# Patient Record
Sex: Female | Born: 2000 | Race: Black or African American | Hispanic: No | Marital: Single | Smoking: Never smoker
Health system: Southern US, Community
[De-identification: ages and names within clinical notes are randomized; demographics above are authoritative.]

## PROBLEM LIST (undated history)

## (undated) DIAGNOSIS — J45909 Unspecified asthma, uncomplicated: Secondary | ICD-10-CM

## (undated) DIAGNOSIS — E669 Obesity, unspecified: Secondary | ICD-10-CM

## (undated) DIAGNOSIS — T7840XA Allergy, unspecified, initial encounter: Secondary | ICD-10-CM

## (undated) HISTORY — DX: Unspecified asthma, uncomplicated: J45.909

## (undated) HISTORY — PX: ADENOIDECTOMY: SUR15

## (undated) HISTORY — DX: Allergy, unspecified, initial encounter: T78.40XA

## (undated) HISTORY — DX: Obesity, unspecified: E66.9

## (undated) HISTORY — PX: TONSILLECTOMY: SUR1361

---
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
## 2019-09-06 DIAGNOSIS — Z20822 Contact with and (suspected) exposure to covid-19: Secondary | ICD-10-CM | POA: Diagnosis not present

## 2019-10-02 DIAGNOSIS — F321 Major depressive disorder, single episode, moderate: Secondary | ICD-10-CM | POA: Diagnosis not present

## 2019-10-23 DIAGNOSIS — F321 Major depressive disorder, single episode, moderate: Secondary | ICD-10-CM | POA: Diagnosis not present

## 2019-11-20 DIAGNOSIS — F321 Major depressive disorder, single episode, moderate: Secondary | ICD-10-CM | POA: Diagnosis not present

## 2019-12-11 DIAGNOSIS — F321 Major depressive disorder, single episode, moderate: Secondary | ICD-10-CM | POA: Diagnosis not present

## 2019-12-25 DIAGNOSIS — F321 Major depressive disorder, single episode, moderate: Secondary | ICD-10-CM | POA: Diagnosis not present

## 2020-01-13 DIAGNOSIS — F321 Major depressive disorder, single episode, moderate: Secondary | ICD-10-CM | POA: Diagnosis not present

## 2020-01-29 DIAGNOSIS — F321 Major depressive disorder, single episode, moderate: Secondary | ICD-10-CM | POA: Diagnosis not present

## 2020-02-19 DIAGNOSIS — F321 Major depressive disorder, single episode, moderate: Secondary | ICD-10-CM | POA: Diagnosis not present

## 2020-02-26 DIAGNOSIS — F321 Major depressive disorder, single episode, moderate: Secondary | ICD-10-CM | POA: Diagnosis not present

## 2020-03-04 DIAGNOSIS — F321 Major depressive disorder, single episode, moderate: Secondary | ICD-10-CM | POA: Diagnosis not present

## 2020-03-16 DIAGNOSIS — F321 Major depressive disorder, single episode, moderate: Secondary | ICD-10-CM | POA: Diagnosis not present

## 2020-04-01 DIAGNOSIS — F321 Major depressive disorder, single episode, moderate: Secondary | ICD-10-CM | POA: Diagnosis not present

## 2020-04-08 ENCOUNTER — Other Ambulatory Visit: Payer: Self-pay

## 2020-04-08 ENCOUNTER — Encounter: Payer: Self-pay | Admitting: Family Medicine

## 2020-04-08 ENCOUNTER — Ambulatory Visit: Payer: Federal, State, Local not specified - PPO | Admitting: Family Medicine

## 2020-04-08 VITALS — BP 120/82 | HR 63 | Temp 98.2°F | Ht 68.5 in | Wt 264.2 lb

## 2020-04-08 DIAGNOSIS — J4520 Mild intermittent asthma, uncomplicated: Secondary | ICD-10-CM | POA: Insufficient documentation

## 2020-04-08 DIAGNOSIS — F32A Depression, unspecified: Secondary | ICD-10-CM

## 2020-04-08 DIAGNOSIS — Z634 Disappearance and death of family member: Secondary | ICD-10-CM | POA: Insufficient documentation

## 2020-04-08 DIAGNOSIS — E669 Obesity, unspecified: Secondary | ICD-10-CM

## 2020-04-08 DIAGNOSIS — L02411 Cutaneous abscess of right axilla: Secondary | ICD-10-CM | POA: Insufficient documentation

## 2020-04-08 DIAGNOSIS — R7309 Other abnormal glucose: Secondary | ICD-10-CM | POA: Insufficient documentation

## 2020-04-08 DIAGNOSIS — F329 Major depressive disorder, single episode, unspecified: Secondary | ICD-10-CM

## 2020-04-08 MED ORDER — ALBUTEROL SULFATE HFA 108 (90 BASE) MCG/ACT IN AERS: 2.0000 | INHALATION_SPRAY | Freq: Four times a day (QID) | RESPIRATORY_TRACT | 0 refills | Status: DC | PRN

## 2020-04-08 NOTE — Progress Notes (Signed)
Subjective:    Patient ID: Erica Moody, female    DOB: 11-28-2000, 19 y.o.   MRN: [id number]  HPI Chief Complaint  Patient presents with   new pt    new pt get established. has cyst under right arm for the last couple years. comes and goes   She is new to the practice and here to establish care.  Previous medical care: Calla Kicks, NP pediatrician   Other providers: None   Complains of an abscess in her right axilla for the past week. States has been getting abscesses in both axilla intermittently over the past few years. States she was advised to see a dermatologist by her previous PCP but she has not yet.   Denies fever, chills, dizziness,  abdominal pain, N/V/D.    Asthma- since childhood.  Triggers are exercise and allergies all year.  Does not have an albuterol inhaler.   States she has depression due to loss of her mother a few months ago. She also had a cousin pass away. She is now living with her grandmother. Her father was paying for her to get counseling but states she was told her bill was not paid so she can not continue with her current counselor.  Denies any thoughts of SI Is not self medicating.    Social history: Lives with her grandmother, works as a Investment banker, operational at [employer]  Denies smoking and drug use. Alcohol once weekly    Last Menstrual cycle: last week. History of irregular periods.  Denies ever being sexually active   Depression screen [facility] 2/9 04/08/2020 02/22/2016  Decreased Interest 1 0  Down, Depressed, Hopeless 2 1  PHQ - 2 Score 3 1  Altered sleeping 3 0  Tired, decreased energy 3 0  Change in appetite 0 0  Feeling bad or failure about yourself  0 1  Trouble concentrating 2 0  Moving slowly or fidgety/restless 0 0  Suicidal thoughts 0 0  PHQ-9 Score 11 2  Difficult doing work/chores Not difficult at all -      Reviewed allergies, medications, past medical, surgical, family, and social history.    Review of Systems Pertinent  positives and negatives in the history of present illness.     Objective:   Physical Exam Constitutional:      Appearance: Normal appearance.  Cardiovascular:     Rate and Rhythm: Normal rate and regular rhythm.     Pulses: Normal pulses.  Pulmonary:     Effort: Pulmonary effort is normal.     Breath sounds: Normal breath sounds.  Skin:    General: Skin is warm and dry.     Comments: Right axilla with abscess, mild induration, no fluctuance. Mild surrounding erythema  Neurological:     Mental Status: She is alert.    BP 120/82    Pulse 63    Temp 98.2 F (36.8 C)    Ht 5' 8.5" (1.74 m)    Wt 264 lb 3.2 oz (119.8 kg)    BMI 39.59 kg/m        Assessment & Plan:  Abscess of right axilla - Plan: CBC with Differential/Platelet, Comprehensive metabolic panel -she has been dealing with this for quite some time. I will prescribe an antibiotic. Use warm compresses. Refer to dermatologist.   Mild intermittent asthma without complication - Plan: albuterol (VENTOLIN HFA) 108 (90 Base) MCG/ACT inhaler -controlled. Refill albuterol.   Obesity (BMI 30-39.9) - Plan: CBC with Differential/Platelet, Comprehensive metabolic panel, Hemoglobin  A1c, TSH, T4, free -counseling on healthy diet   Elevated hemoglobin A1c - Plan: Hemoglobin A1c -hx of Hgb A1c 5.7%  Death of parent -discussed finding a counselor and I will give her a list. Discussed grief and stages and allowing herself to feel all the stages.   Depression, unspecified depression type -recommend counseling. Follow up in 4 weeks

## 2020-04-08 NOTE — Patient Instructions (Addendum)
Dermatology offices  [facility]: Phone #: [phone] Address: [address], [place], [place] [address]  [facility]: Phone: [phone]  Address: [address], [place], [place] [address]  [facility]: [phone] Address: [address] [place], [place] [address]  [facility] Address: [address] Sherian Maroon [place], [place] [address] Phone: [phone]   Call and schedule for grief counseling  Baxter Kail-  Ask about grief counseling [phone]  [facility]  Outpatient counseling  [phone]

## 2020-04-09 LAB — COMPREHENSIVE METABOLIC PANEL
ALT: 14 IU/L (ref 0–32)
AST: 20 IU/L (ref 0–40)
Albumin/Globulin Ratio: 1.3 (ref 1.2–2.2)
Albumin: 4.1 g/dL (ref 3.9–5.0)
Alkaline Phosphatase: 68 IU/L (ref 45–106)
BUN/Creatinine Ratio: 10 (ref 9–23)
BUN: 8 mg/dL (ref 6–20)
Bilirubin Total: 0.2 mg/dL (ref 0.0–1.2)
CO2: 23 mmol/L (ref 20–29)
Calcium: 9.6 mg/dL (ref 8.7–10.2)
Chloride: 104 mmol/L (ref 96–106)
Creatinine, Ser: 0.78 mg/dL (ref 0.57–1.00)
GFR calc Af Amer: 127 mL/min/{1.73_m2} (ref >59)
GFR calc non Af Amer: 111 mL/min/{1.73_m2} (ref >59)
Globulin, Total: 3.1 g/dL (ref 1.5–4.5)
Glucose: 90 mg/dL (ref 65–99)
Potassium: 4.1 mmol/L (ref 3.5–5.2)
Sodium: 141 mmol/L (ref 134–144)
Total Protein: 7.2 g/dL (ref 6.0–8.5)

## 2020-04-09 LAB — CBC WITH DIFFERENTIAL/PLATELET
Basophils Absolute: 0 10*3/uL (ref 0.0–0.2)
Basos: 0 %
EOS (ABSOLUTE): 0.1 10*3/uL (ref 0.0–0.4)
Eos: 2 %
Hematocrit: 36.2 % (ref 34.0–46.6)
Hemoglobin: 11.1 g/dL (ref 11.1–15.9)
Immature Grans (Abs): 0 10*3/uL (ref 0.0–0.1)
Immature Granulocytes: 0 %
Lymphocytes Absolute: 1.9 10*3/uL (ref 0.7–3.1)
Lymphs: 32 %
MCH: 22.7 pg — ABNORMAL LOW (ref 26.6–33.0)
MCHC: 30.7 g/dL — ABNORMAL LOW (ref 31.5–35.7)
MCV: 74 fL — ABNORMAL LOW (ref 79–97)
Monocytes Absolute: 0.5 10*3/uL (ref 0.1–0.9)
Monocytes: 9 %
Neutrophils Absolute: 3.4 10*3/uL (ref 1.4–7.0)
Neutrophils: 57 %
Platelets: 397 10*3/uL (ref 150–450)
RBC: 4.89 x10E6/uL (ref 3.77–5.28)
RDW: 14.2 % (ref 11.7–15.4)
WBC: 5.8 10*3/uL (ref 3.4–10.8)

## 2020-04-09 LAB — HEMOGLOBIN A1C
Est. average glucose Bld gHb Est-mCnc: 120 mg/dL
Hgb A1c MFr Bld: 5.8 % — ABNORMAL HIGH (ref 4.8–5.6)

## 2020-04-09 LAB — T4, FREE: Free T4: 1.24 ng/dL (ref 0.93–1.60)

## 2020-04-09 LAB — TSH: TSH: 2.37 u[IU]/mL (ref 0.450–4.500)

## 2020-04-09 MED ORDER — SULFAMETHOXAZOLE-TRIMETHOPRIM 800-160 MG PO TABS: 1.0000 | ORAL_TABLET | Freq: Two times a day (BID) | ORAL | 0 refills | Status: DC

## 2020-04-10 ENCOUNTER — Encounter: Payer: Self-pay | Admitting: Family Medicine

## 2020-04-10 DIAGNOSIS — R7303 Prediabetes: Secondary | ICD-10-CM | POA: Insufficient documentation

## 2020-04-10 HISTORY — DX: Prediabetes: R73.03

## 2020-04-10 NOTE — Progress Notes (Signed)
Her labs show that she has prediabetes which increases her risk of developing diabetes down the road if she does not change her diet and increase her activity level. I recommend cutting back on sugar (drinks, candy, cookies, cakes and other snacks with sugar in them) and carbohydrates such as bread, potatoes, pasta, rice.  Her labs are fine otherwise. If she would like to discuss any of this further, we can at her follow up or sooner if she prefers.

## 2020-04-15 DIAGNOSIS — F321 Major depressive disorder, single episode, moderate: Secondary | ICD-10-CM | POA: Diagnosis not present

## 2020-05-04 NOTE — Progress Notes (Signed)
   Subjective:    Patient ID: Erica Moody, female    DOB: [birth year], 19 y.o.   MRN: [id number]  HPI Chief Complaint  Patient presents with  . 4 week follow-up    4 week follow-up. doing fine   She is here to follow up on depression and anxiety.  She is living with her grandmother and uncle. 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   Has seen a counselor, Duwayne Heck, and has another appointment tomorrow. States they are working on her anxiety issues.   Sleeping only 5 hours per night since January. States she has trouble falling asleep. Has tried melatonin and it did not help. Exercise helps. She eats a large meal before bed.  Eating once daily.    Denies fever, chills, dizziness, chest pain, palpitations, shortness of breath, abdominal pain, N/V/D, urinary symptoms.   LMP: last week   Only uses albuterol before exercise.   Prediabetes- Hgb A1c 5.8%      Review of Systems Pertinent positives and negatives in the history of present illness.     Objective:   Physical Exam BP 120/80   Pulse (!) 55   Wt 259 lb 6.4 oz (117.7 kg)   BMI 38.87 kg/m   Alert and oriented and in no acute distress.  Her mood is depressed.  Not examined otherwise.      Assessment & Plan:  Depression, unspecified depression type  Anxiety  Death of parent  Prediabetes  Insomnia, unspecified type  Recommend she continue seeing her counselor.  She is giving herself permission to deal with her grief.  Good sleep hygiene discussed.  Counseling on ways to help her retrain herself to go to sleep.  Recommend eating small frequent meals throughout the day and not eating before bedtime. Discussed healthy diet and lifestyle.  Reviewed labs from previous visit.  Discussed prediabetes Follow-up as needed

## 2020-05-05 ENCOUNTER — Encounter: Payer: Self-pay | Admitting: Family Medicine

## 2020-05-05 ENCOUNTER — Other Ambulatory Visit: Payer: Self-pay

## 2020-05-05 ENCOUNTER — Ambulatory Visit: Payer: Federal, State, Local not specified - PPO | Admitting: Family Medicine

## 2020-05-05 VITALS — BP 120/80 | HR 55 | Wt 259.4 lb

## 2020-05-05 DIAGNOSIS — F329 Major depressive disorder, single episode, unspecified: Secondary | ICD-10-CM

## 2020-05-05 DIAGNOSIS — F419 Anxiety disorder, unspecified: Secondary | ICD-10-CM

## 2020-05-05 DIAGNOSIS — Z634 Disappearance and death of family member: Secondary | ICD-10-CM | POA: Diagnosis not present

## 2020-05-05 DIAGNOSIS — R7303 Prediabetes: Secondary | ICD-10-CM

## 2020-05-05 DIAGNOSIS — F32A Depression, unspecified: Secondary | ICD-10-CM

## 2020-05-05 DIAGNOSIS — G4700 Insomnia, unspecified: Secondary | ICD-10-CM

## 2020-05-06 DIAGNOSIS — F321 Major depressive disorder, single episode, moderate: Secondary | ICD-10-CM | POA: Diagnosis not present

## 2020-05-11 DIAGNOSIS — L700 Acne vulgaris: Secondary | ICD-10-CM | POA: Diagnosis not present

## 2020-05-11 DIAGNOSIS — L732 Hidradenitis suppurativa: Secondary | ICD-10-CM | POA: Diagnosis not present

## 2020-05-11 DIAGNOSIS — L249 Irritant contact dermatitis, unspecified cause: Secondary | ICD-10-CM | POA: Diagnosis not present

## 2020-05-27 DIAGNOSIS — F321 Major depressive disorder, single episode, moderate: Secondary | ICD-10-CM | POA: Diagnosis not present

## 2020-06-17 DIAGNOSIS — F321 Major depressive disorder, single episode, moderate: Secondary | ICD-10-CM | POA: Diagnosis not present

## 2020-07-12 DIAGNOSIS — L732 Hidradenitis suppurativa: Secondary | ICD-10-CM | POA: Diagnosis not present

## 2020-07-12 DIAGNOSIS — L700 Acne vulgaris: Secondary | ICD-10-CM | POA: Diagnosis not present

## 2020-07-20 DIAGNOSIS — F321 Major depressive disorder, single episode, moderate: Secondary | ICD-10-CM | POA: Diagnosis not present

## 2020-07-23 ENCOUNTER — Ambulatory Visit: Payer: Federal, State, Local not specified - PPO | Admitting: Family Medicine

## 2020-07-23 ENCOUNTER — Encounter: Payer: Self-pay | Admitting: Family Medicine

## 2020-07-23 ENCOUNTER — Other Ambulatory Visit: Payer: Self-pay

## 2020-07-23 VITALS — BP 120/80 | HR 61 | Wt 259.2 lb

## 2020-07-23 DIAGNOSIS — Z30011 Encounter for initial prescription of contraceptive pills: Secondary | ICD-10-CM | POA: Diagnosis not present

## 2020-07-23 DIAGNOSIS — L732 Hidradenitis suppurativa: Secondary | ICD-10-CM | POA: Diagnosis not present

## 2020-07-23 DIAGNOSIS — N920 Excessive and frequent menstruation with regular cycle: Secondary | ICD-10-CM | POA: Diagnosis not present

## 2020-07-23 LAB — POCT URINE PREGNANCY: Preg Test, Ur: NEGATIVE

## 2020-07-23 MED ORDER — NORETHIN ACE-ETH ESTRAD-FE 1-20 MG-MCG PO TABS: 1.0000 | ORAL_TABLET | Freq: Every day | ORAL | 11 refills | Status: DC

## 2020-07-23 NOTE — Progress Notes (Signed)
° °  Subjective:    Patient ID: Erica Moody, female    DOB: [birth year], 19 y.o.   MRN: [id number]  HPI Chief Complaint  Patient presents with   discuss birth control    Never been on birth control before, irregular periods   She is here to discuss starting on birth control for the first time. She would like birth control pills and prefers to stop her periods.   States her period this month lasted over 2 weeks and just ended 4-5 days ago. States this happened one year ago.  LMP: 06/20/2020   Normally her periods last 7-10 days and are basically regular. States she has severe cramping and nausea the first 2-3 days of her cycle.   Denies ever being sexually active and has not plan to become sexually active. States she does not want birth control to prevent pregnancy, only to help with her periods.   History of migraine headaches but none in the past few months.  Does not smoke.  Denies personal or family history of DVT or blood clots.  Denies fever, chills, dizziness, chest pain, palpitations, shortness of breath, abdominal pain, N/V/D, urinary symptoms, or vaginal discharge.   She is taking Doxycycline daily for hidradenitis suppurative.  This is prescribed by her dermatologist.  She is seeing a therapist for depression.   Reviewed allergies, medications, past medical, surgical, family, and social history.    Review of Systems Pertinent positives and negatives in the history of present illness.     Objective:   Physical Exam BP 120/80    Pulse 61    Wt 259 lb 3.2 oz (117.6 kg)    LMP 06/20/2020 (Exact Date)    BMI 38.84 kg/m   Alert and oriented and in no acute distress.  Not otherwise examined.      Assessment & Plan:  Encounter for BCP (birth control pills) initial prescription - Plan: POCT urine pregnancy, norethindrone-ethinyl estradiol (JUNEL FE 1/20) 1-20 MG-MCG tablet  Hidradenitis suppurativa  Menorrhagia with regular cycle - Plan: POCT urine  pregnancy  In-depth counseling on birth control options and offered to send her to a gynecologist to discuss these further in case she would prefer IUD, Nexplanon or other types of birth control.  She is only interested in starting on birth control pills.  She would like to take these continuously.  Discussed proper administration of the pill in order for it to be most effective.  Discussed most common side effects.  she is aware that she would need to use a second form of contraception for the first month if she decides to become sexually active.  She is also aware that birth control pills do not prevent STDs.  She denies being sexually active. Discussed obesity and migraine headaches may increase her risk of having an adverse event.  Discussed VTE risk. Urine pregnancy test is negative Encouraged her to follow-up with me 4 weeks after starting on the pill or sooner if she has any questions or concerns

## 2020-07-23 NOTE — Patient Instructions (Signed)
Birth control pills do not protect against any sexually transmitted infections.   Start this pack on the Sunday after your next cycle begins.   Avoid missing doses.   Call us with the antibiotic you are taking.   Follow up in 4 weeks     Oral Contraception Information Oral contraceptive pills (OCPs) are medicines taken to prevent pregnancy. OCPs are taken by mouth, and they work by:  Preventing the ovaries from releasing eggs.  Thickening mucus in the lower part of the uterus (cervix), which prevents sperm from entering the uterus.  Thinning the lining of the uterus (endometrium), which prevents a fertilized egg from attaching to the endometrium. OCPs are highly effective when taken exactly as prescribed. However, OCPs do not prevent STIs (sexually transmitted infections). Safe sex practices, such as using condoms while on an OCP, can help prevent STIs. Before starting OCPs Before you start taking OCPs, you may have a physical exam, blood test, and Pap test. However, you are not required to have a pelvic exam in order to be prescribed OCPs. Your health care provider will make sure you are a good candidate for oral contraception. OCPs are not a good option for certain women, including women who smoke and are older than 35 years, and women with a medical history of high blood pressure, deep vein thrombosis, pulmonary embolism, stroke, cardiovascular disease, or peripheral vascular disease. Discuss with your health care provider the possible side effects of the OCP you may be prescribed. When you start an OCP, be aware that it can take 2-3 months for your body to adjust to changes in hormone levels. Follow instructions from your health care provider about how to start taking your first cycle of OCPs. Depending on when you start the pill, you may need to use a backup form of birth control, such as condoms, during the first week. Make sure you know what steps to take if you ever forget to take the  pill. Types of oral contraception  The most common types of birth control pills contain the hormones estrogen and progestin (synthetic progesterone) or progestin only. The combination pill This type of pill contains estrogen and progestin hormones. Combination pills often come in packs of 21, 28, or 91 pills. For each pack, the last 7 pills may not contain hormones, which means you may stop taking the pills for 7 days. Menstrual bleeding occurs during the week that you do not take the pills or that you take the pills with no hormones in them. The minipill This type of pill contains the progestin hormone only. It comes in packs of 28 pills. All 28 pills contain the hormone. You take the pill every day. It is very important to take the pill at the same time each day. Advantages of oral contraceptive pills  Provides reliable and continuous contraception if taken as instructed.  May treat or decrease symptoms of: ? Menstrual period cramps. ? Irregular menstrual cycle or bleeding. ? Heavy menstrual flow. ? Abnormal uterine bleeding. ? Acne, depending on the type of pill. ? Polycystic ovarian syndrome. ? Endometriosis. ? Iron deficiency anemia. ? Premenstrual symptoms, including premenstrual dysphoric disorder.  May reduce the risk of endometrial and ovarian cancer.  Can be used as emergency contraception.  Prevents mislocated (ectopic) pregnancies and infections of the fallopian tubes. Things that can make oral contraceptive pills less effective OCPs can be less effective if:  You forget to take the pill at the same time every day. This is especially  important when taking the minipill.  You have a stomach or intestinal disease that reduces your body's ability to absorb the pill.  You take OCPs with other medicines that make OCPs less effective, such as antibiotics, certain HIV medicines, and some seizure medicines.  You take expired OCPs.  You forget to restart the pill on day 7, if  using the packs of 21 pills. Risks associated with oral contraceptive pills Oral contraceptive pills can sometimes cause side effects, such as:  Headache.  Depression.  Trouble sleeping.  Nausea and vomiting.  Breast tenderness.  Irregular bleeding or spotting during the first several months.  Bloating or fluid retention.  Increase in blood pressure. Combination pills are also associated with a small increase in the risk of:  Blood clots.  Heart attack.  Stroke. Summary  Oral contraceptive pills are medicines taken by mouth to prevent pregnancy. They are highly effective when taken exactly as prescribed.  The most common types of birth control pills contain the hormones estrogen and progestin (synthetic progesterone) or progestin only.  Before you start taking the pill, you may have a physical exam, blood test, and Pap test. Your health care provider will make sure you are a good candidate for oral contraception.  The combination pill may come in a 21-day pack, a 28-day pack, or a 91-day pack. The minipill contains the progesterone hormone only and comes in packs of 28 pills.  Oral contraceptive pills can sometimes cause side effects, such as headache, nausea, breast tenderness, or irregular bleeding. This information is not intended to replace advice given to you by your health care provider. Make sure you discuss any questions you have with your health care provider. Document Revised: 07/06/2017 Document Reviewed: 10/17/2016 Elsevier Patient Education  2020 [employer].

## 2020-08-24 ENCOUNTER — Ambulatory Visit: Payer: Federal, State, Local not specified - PPO | Admitting: Family Medicine

## 2020-08-26 ENCOUNTER — Other Ambulatory Visit: Payer: Self-pay

## 2020-08-26 ENCOUNTER — Encounter: Payer: Self-pay | Admitting: Family Medicine

## 2020-08-26 ENCOUNTER — Ambulatory Visit: Payer: Federal, State, Local not specified - PPO | Admitting: Family Medicine

## 2020-08-26 VITALS — BP 120/80 | HR 61 | Wt 260.6 lb

## 2020-08-26 DIAGNOSIS — F419 Anxiety disorder, unspecified: Secondary | ICD-10-CM

## 2020-08-26 DIAGNOSIS — N920 Excessive and frequent menstruation with regular cycle: Secondary | ICD-10-CM

## 2020-08-26 DIAGNOSIS — Z3041 Encounter for surveillance of contraceptive pills: Secondary | ICD-10-CM

## 2020-08-26 DIAGNOSIS — F32A Depression, unspecified: Secondary | ICD-10-CM | POA: Diagnosis not present

## 2020-08-26 NOTE — Patient Instructions (Signed)
You can call to schedule your appointment with the counselor. A few offices are listed below for you to call.    [facility] [address] [place], [place] [address]  Phone: [phone]  [facility]  [address], [place], [place] [address]  Phone: [phone]  [facility] [address] [place], [place] [address] [phone] for appointments   [facility]  [address] [place], [place] [address] [phone]  [facility]  [address]  (across from [facility])  [phone]   [facility] Resident only [address], [place], [place] [address] [phone]  [facility] [address] [place],  [address] [phone]       

## 2020-08-26 NOTE — Progress Notes (Signed)
   Subjective:    Patient ID: Erica Moody, female    DOB: 06/18/2001, 20 y.o.   MRN: [id number]  HPI Chief Complaint  Patient presents with  . follow-up    Follow-up on birth control   She is here to follow up on starting birth control pills as well as anxiety and depression.  States she took the first pack of OCPs and then did not take the placebo, she started the second pack but still had her menstrual cycle. Her cycle is currently still ongoing as usual. States this is day 8 and typically she bleeds for 10 days. She did not notice any improvement in abdominal cramping. No worsening symptoms. She is not sexually active. States she did have some mild symptoms after starting the pills including nausea, headache and breast tenderness. All symptoms have resolved.  She took the [employer] vaccine and states she is still having some body aches from the vaccine. She does plan on getting her second dose.  Depression and anxiety are an ongoing issue since her mother passed. States she has not been to her counselor recently. States her counselor actually with the practice. She is looking for a Therapist, nutritional. She denies any thoughts of SI. States she recently quit her job to focus on applying to [employer] school. States her home situation is fine, she is living with her aunt.  Denies fever, chills, dizziness, chest pain, palpitations, shortness of breath, abdominal pain, N/V/D, urinary symptoms, LE edema.    Review of Systems Pertinent positives and negatives in the history of present illness.     Objective:   Physical Exam BP 120/80   Pulse 61   Wt 260 lb 9.6 oz (118.2 kg)   BMI 39.05 kg/m   Alert and oriented and in no acute distress. Respirations unlabored. Her mood is depressed and her affect is fairly flat (unchanged). Denies SI      Assessment & Plan:  Menorrhagia with regular cycle  Visit for birth control pills maintenance  Anxiety and depression  Continue on  birth control pills. Follow-up with me in 6 weeks. If she is taking the pill continuously but still has her menstrual cycle next month then I will refer her to OB/GYN. I recommend counseling, finding a new therapist since her previous one moved. A list has been provided and she will call and schedule the appointment. She'll follow-up with me in 6 weeks virtually or sooner if needed.

## 2020-10-07 ENCOUNTER — Telehealth: Payer: Federal, State, Local not specified - PPO | Admitting: Family Medicine

## 2020-10-07 ENCOUNTER — Other Ambulatory Visit: Payer: Self-pay

## 2020-10-07 ENCOUNTER — Encounter: Payer: Self-pay | Admitting: Family Medicine

## 2020-10-07 VITALS — Wt 256.0 lb

## 2020-10-07 DIAGNOSIS — E669 Obesity, unspecified: Secondary | ICD-10-CM | POA: Diagnosis not present

## 2020-10-07 DIAGNOSIS — Z8742 Personal history of other diseases of the female genital tract: Secondary | ICD-10-CM | POA: Insufficient documentation

## 2020-10-07 DIAGNOSIS — F419 Anxiety disorder, unspecified: Secondary | ICD-10-CM | POA: Diagnosis not present

## 2020-10-07 DIAGNOSIS — N921 Excessive and frequent menstruation with irregular cycle: Secondary | ICD-10-CM | POA: Diagnosis not present

## 2020-10-07 DIAGNOSIS — F32A Depression, unspecified: Secondary | ICD-10-CM | POA: Insufficient documentation

## 2020-10-07 NOTE — Progress Notes (Signed)
   Subjective:  Documentation for virtual audio and video telecommunications through Caregility encounter:  The patient was located at home. 2 patient identifiers used.  The provider was located in the office. The patient did consent to this visit and is aware of possible charges through their insurance for this visit.  The other persons participating in this telemedicine service were none. Time spent on call was 15 minutes and in review of previous records 20 minutes total.  This virtual service is not related to other E/M service within previous 7 days.   Patient ID: Erica Moody, female    DOB: 2001/06/24, 19 y.o.   MRN: [id number]  HPI Chief Complaint  Patient presents with  . other    Having periods last longer than usually   Complains of having prolonged menstrual bleeding since 08/19/2020. States she is now at the 50 day mark of vaginal bleeding. States some days is just spotting and some days are normal period blood flow.   We started her on birth control for irregular menses in 07/2020. States she has been taking them daily at the same time.  Previously her periods were irregular and would last 2-4 weeks as well.   Denies ever having sex  Negative UPT done prior to initial prescription of OCPs  Denies fever, chills, headache, dizziness, chest pain, palpitations, shortness of breath, abdominal pain, N/V/D, urinary symptoms. No other bleeding noted.  No known history of bleeding disorders in family.    Prediabetes in 04/2020  History of anxiety and depression and states her mood is the same as the last time we spoke.  She is not on medication for this.  She was seeing a therapist but stopped and has not seen one since her last visit.  States her father pays for her medical visits and he "dropped the ball".  States she plans to call and schedule with one.  Normal thyroid function in 04/2020 and no anemia at that time.   Review of Systems Pertinent positives and negatives  in the history of present illness.     Objective:   Physical Exam Wt 256 lb (116.1 kg)   LMP 08/19/2020   BMI 38.36 kg/m   Alert and oriented and in no acute distress.  Normal respirations.  Normal speech, mood and thought process.      Assessment & Plan:  Prolonged menstrual cycle -Reports having menstrual bleeding since August 19, 2020 in spite of being on continuous OCPs.  Birth control pills started in December 2021 due to irregular and prolonged menstrual bleeding.  She appears to be taking her pills appropriately.  Denies symptoms of anemia. I will refer her to OB/GYN for further evaluation and treatment.  History of irregular menstrual bleeding  Obesity (BMI 30-39.9)  Anxiety and depression -Mood appears unchanged.  States she will call today and schedule with a therapist.  No SI

## 2020-10-18 DIAGNOSIS — Z3041 Encounter for surveillance of contraceptive pills: Secondary | ICD-10-CM | POA: Diagnosis not present

## 2020-10-18 DIAGNOSIS — R5383 Other fatigue: Secondary | ICD-10-CM | POA: Diagnosis not present

## 2020-10-18 DIAGNOSIS — N925 Other specified irregular menstruation: Secondary | ICD-10-CM | POA: Diagnosis not present

## 2020-10-25 ENCOUNTER — Encounter: Payer: Federal, State, Local not specified - PPO | Admitting: Nurse Practitioner

## 2021-01-05 ENCOUNTER — Other Ambulatory Visit: Payer: Self-pay | Admitting: Family Medicine

## 2021-01-05 DIAGNOSIS — J4520 Mild intermittent asthma, uncomplicated: Secondary | ICD-10-CM

## 2021-01-26 DIAGNOSIS — L732 Hidradenitis suppurativa: Secondary | ICD-10-CM | POA: Diagnosis not present

## 2021-04-01 DIAGNOSIS — F4321 Adjustment disorder with depressed mood: Secondary | ICD-10-CM | POA: Diagnosis not present

## 2021-04-01 DIAGNOSIS — F411 Generalized anxiety disorder: Secondary | ICD-10-CM | POA: Diagnosis not present

## 2021-04-13 DIAGNOSIS — F4321 Adjustment disorder with depressed mood: Secondary | ICD-10-CM | POA: Diagnosis not present

## 2021-04-13 DIAGNOSIS — F411 Generalized anxiety disorder: Secondary | ICD-10-CM | POA: Diagnosis not present

## 2021-05-03 DIAGNOSIS — F4321 Adjustment disorder with depressed mood: Secondary | ICD-10-CM | POA: Diagnosis not present

## 2021-05-03 DIAGNOSIS — F411 Generalized anxiety disorder: Secondary | ICD-10-CM | POA: Diagnosis not present

## 2021-05-27 DIAGNOSIS — F411 Generalized anxiety disorder: Secondary | ICD-10-CM | POA: Diagnosis not present

## 2021-05-27 DIAGNOSIS — F4321 Adjustment disorder with depressed mood: Secondary | ICD-10-CM | POA: Diagnosis not present

## 2021-06-22 DIAGNOSIS — F411 Generalized anxiety disorder: Secondary | ICD-10-CM | POA: Diagnosis not present

## 2021-06-22 DIAGNOSIS — F4321 Adjustment disorder with depressed mood: Secondary | ICD-10-CM | POA: Diagnosis not present

## 2021-07-05 DIAGNOSIS — Z79891 Long term (current) use of opiate analgesic: Secondary | ICD-10-CM | POA: Diagnosis not present

## 2021-07-05 DIAGNOSIS — F331 Major depressive disorder, recurrent, moderate: Secondary | ICD-10-CM | POA: Diagnosis not present

## 2021-07-05 DIAGNOSIS — F411 Generalized anxiety disorder: Secondary | ICD-10-CM | POA: Diagnosis not present

## 2021-07-06 DIAGNOSIS — F411 Generalized anxiety disorder: Secondary | ICD-10-CM | POA: Diagnosis not present

## 2021-07-06 DIAGNOSIS — F4321 Adjustment disorder with depressed mood: Secondary | ICD-10-CM | POA: Diagnosis not present

## 2021-07-14 ENCOUNTER — Telehealth: Payer: Self-pay | Admitting: Family Medicine

## 2021-07-14 DIAGNOSIS — J4520 Mild intermittent asthma, uncomplicated: Secondary | ICD-10-CM

## 2021-07-14 NOTE — Telephone Encounter (Signed)
Pt called for refills of albuterol. Please send to [facility] on [address]. Pt can be reached at [phone].

## 2021-07-15 MED ORDER — ALBUTEROL SULFATE HFA 108 (90 BASE) MCG/ACT IN AERS: 2.0000 | INHALATION_SPRAY | Freq: Four times a day (QID) | RESPIRATORY_TRACT | 0 refills | Status: DC | PRN

## 2021-07-22 DIAGNOSIS — F411 Generalized anxiety disorder: Secondary | ICD-10-CM | POA: Diagnosis not present

## 2021-07-22 DIAGNOSIS — F4321 Adjustment disorder with depressed mood: Secondary | ICD-10-CM | POA: Diagnosis not present

## 2021-08-17 DIAGNOSIS — F411 Generalized anxiety disorder: Secondary | ICD-10-CM | POA: Diagnosis not present

## 2021-08-17 DIAGNOSIS — F331 Major depressive disorder, recurrent, moderate: Secondary | ICD-10-CM | POA: Diagnosis not present

## 2021-08-23 DIAGNOSIS — F411 Generalized anxiety disorder: Secondary | ICD-10-CM | POA: Diagnosis not present

## 2021-08-23 DIAGNOSIS — F4321 Adjustment disorder with depressed mood: Secondary | ICD-10-CM | POA: Diagnosis not present

## 2021-08-26 DIAGNOSIS — J4521 Mild intermittent asthma with (acute) exacerbation: Secondary | ICD-10-CM | POA: Diagnosis not present

## 2021-08-29 DIAGNOSIS — H6693 Otitis media, unspecified, bilateral: Secondary | ICD-10-CM | POA: Diagnosis not present

## 2021-08-29 DIAGNOSIS — J019 Acute sinusitis, unspecified: Secondary | ICD-10-CM | POA: Diagnosis not present

## 2021-09-02 ENCOUNTER — Encounter (HOSPITAL_COMMUNITY): Payer: Self-pay

## 2021-09-02 ENCOUNTER — Emergency Department (HOSPITAL_COMMUNITY)
Admission: EM | Admit: 2021-09-02 | Discharge: 2021-09-02 | Disposition: A | Discharge status: Home / Self Care | Payer: Federal, State, Local not specified - PPO | Attending: Emergency Medicine | Admitting: Emergency Medicine

## 2021-09-02 ENCOUNTER — Emergency Department (HOSPITAL_COMMUNITY): Payer: Federal, State, Local not specified - PPO

## 2021-09-02 ENCOUNTER — Other Ambulatory Visit: Payer: Self-pay

## 2021-09-02 DIAGNOSIS — R0600 Dyspnea, unspecified: Secondary | ICD-10-CM | POA: Diagnosis not present

## 2021-09-02 DIAGNOSIS — R0602 Shortness of breath: Secondary | ICD-10-CM | POA: Diagnosis not present

## 2021-09-02 DIAGNOSIS — R059 Cough, unspecified: Secondary | ICD-10-CM | POA: Diagnosis not present

## 2021-09-02 DIAGNOSIS — J4541 Moderate persistent asthma with (acute) exacerbation: Secondary | ICD-10-CM | POA: Insufficient documentation

## 2021-09-02 MED ORDER — PREDNISONE 20 MG PO TABS
60.0000 mg | ORAL_TABLET | Freq: Once | ORAL | Status: AC
  Administered 2021-09-02: 60 mg via ORAL
  Filled 2021-09-02: qty 3

## 2021-09-02 MED ORDER — ALBUTEROL (5 MG/ML) CONTINUOUS INHALATION SOLN
10.0000 mg/h | INHALATION_SOLUTION | Freq: Once | RESPIRATORY_TRACT | Status: DC
  Filled 2021-09-02: qty 20

## 2021-09-02 MED ORDER — ALBUTEROL SULFATE (2.5 MG/3ML) 0.083% IN NEBU
10.0000 mg | INHALATION_SOLUTION | Freq: Once | RESPIRATORY_TRACT | Status: AC
  Administered 2021-09-02: 10 mg via RESPIRATORY_TRACT

## 2021-09-02 MED ORDER — ALBUTEROL SULFATE HFA 108 (90 BASE) MCG/ACT IN AERS: 2.0000 | INHALATION_SPRAY | RESPIRATORY_TRACT | 0 refills | Status: DC | PRN

## 2021-09-02 MED ORDER — IPRATROPIUM-ALBUTEROL 0.5-2.5 (3) MG/3ML IN SOLN: 3.0000 mL | Freq: Once | RESPIRATORY_TRACT | Status: DC

## 2021-09-02 MED ORDER — PREDNISONE 10 MG PO TABS: ORAL_TABLET | ORAL | 0 refills | Status: DC

## 2021-09-02 MED ORDER — FLUTICASONE PROPIONATE 50 MCG/ACT NA SUSP
2.0000 | Freq: Every day | NASAL | Status: DC
  Administered 2021-09-02: 2 via NASAL
  Filled 2021-09-02: qty 16

## 2021-09-02 MED ORDER — ALBUTEROL SULFATE HFA 108 (90 BASE) MCG/ACT IN AERS
2.0000 | INHALATION_SPRAY | Freq: Once | RESPIRATORY_TRACT | Status: AC
  Administered 2021-09-02: 2 via RESPIRATORY_TRACT
  Filled 2021-09-02: qty 6.7

## 2021-09-02 NOTE — Discharge Instructions (Addendum)
We sent prescriptions to your pharmacy.  Start the prednisone around 6 PM tonight.  Use the fluticasone nasal spray 1 spray in each nostril, twice a day until the bottle is empty.  Call the pulmonologist for a follow-up appointment in a week or so.  Return here if your condition worsens.  Try to not use the albuterol inhaler more than 2 puffs every 3-4 hours.

## 2021-09-02 NOTE — ED Provider Notes (Signed)
[facility]-EMERGENCY DEPT Provider Note   CSN: [id number] Arrival date & time: 09/02/21  0855     History  Chief Complaint  Patient presents with   Cough   Shortness of Breath    Erica Moody is a 21 y.o. female.  HPI She presents for evaluation of persistent shortness of breath for 3 weeks despite using her albuterol inhaler 75 times a day.  She is currently taking Augmentin prescribed, 4 days ago for change in sputum production.  Prior to that she was treated with steroids and an albuterol inhaler about 3 weeks ago at an urgent care.  Today she went to an urgent care was found to be hypoxic with ambulation at 90% so was sent here for further evaluation.  Before 3 weeks ago she had intermittent exercise-induced asthma for which she used albuterol.  She denies fever, chest pain, weakness or dizziness.  There are no other known active modifying factors.  Home Medications Prior to Admission medications   Medication Sig Start Date End Date Taking? Authorizing Provider  albuterol (VENTOLIN HFA) 108 (90 Base) MCG/ACT inhaler Inhale 2 puffs into the lungs every 6 (six) hours as needed for wheezing or shortness of breath. 07/15/21   Ronnald Nian, MD  cetirizine (ZYRTEC) 10 MG tablet Take 1 tablet (10 mg total) by mouth daily. 04/24/16 04/08/20  Georgiann Hahn, MD  ibuprofen (ADVIL,MOTRIN) 800 MG tablet Take 1 tablet (800 mg total) by mouth every 8 (eight) hours as needed for moderate pain. 12/23/15   Hess, Nada Boozer, PA-C  Multiple Vitamins-Minerals (MULTIVITAMIN PO) Take by mouth.    [provider]  norethindrone-ethinyl estradiol (JUNEL FE 1/20) 1-20 MG-MCG tablet Take 1 tablet by mouth daily. 07/23/20   Henson, Vickie L, PA-C  tretinoin (RETIN-A) 0.025 % cream SMARTSIG:Sparingly Topical Every Evening 05/11/20   [provider]  triamcinolone ointment (KENALOG) 0.1 % Apply topically. 05/11/20   [provider]      Allergies    Pineapple     Review of Systems   Review of Systems  All other systems reviewed and are negative.  Physical Exam Updated Vital Signs BP (!) 145/81    Pulse 82    Temp 97.6 F (36.4 C) (Oral)    Resp 18    Ht 5\' 8"  (1.727 m)    Wt 115.2 kg    SpO2 96%    BMI 38.62 kg/m  Physical Exam Vitals and nursing note reviewed.  Constitutional:      General: She is not in acute distress.    Appearance: She is well-developed. She is obese. She is not ill-appearing, toxic-appearing or diaphoretic.  HENT:     Head: Normocephalic and atraumatic.     Right Ear: External ear normal.     Left Ear: External ear normal.     Nose: Congestion present. No rhinorrhea.     Mouth/Throat:     Pharynx: No oropharyngeal exudate or posterior oropharyngeal erythema.     Comments: No swelling of the oropharynx, tongue or sublingual space. Eyes:     Conjunctiva/sclera: Conjunctivae normal.     Pupils: Pupils are equal, round, and reactive to light.  Neck:     Trachea: Phonation normal.  Cardiovascular:     Rate and Rhythm: Normal rate and regular rhythm.     Heart sounds: Normal heart sounds.  Pulmonary:     Effort: Pulmonary effort is normal. No respiratory distress.     Breath sounds: No stridor.  Comments: Decreased air movement bilaterally, no audible wheezes. Abdominal:     General: There is no distension.     Palpations: Abdomen is soft.     Tenderness: There is no abdominal tenderness.  Musculoskeletal:        General: Normal range of motion.     Cervical back: Normal range of motion and neck supple.  Skin:    General: Skin is warm and dry.  Neurological:     Mental Status: She is alert and oriented to person, place, and time.     Cranial Nerves: No cranial nerve deficit.     Sensory: No sensory deficit.     Motor: No abnormal muscle tone.     Coordination: Coordination normal.  Psychiatric:        Mood and Affect: Mood normal.        Behavior: Behavior normal.        Thought Content: Thought  content normal.        Judgment: Judgment normal.    ED Results / Procedures / Treatments   Labs (all labs ordered are listed, but only abnormal results are displayed) Labs Reviewed - No data to display  EKG None  Radiology DG Chest 2 View  Result Date: 09/02/2021 CLINICAL DATA:  A 21 year old female presents for evaluation of cough congestion shortness of breath. EXAM: CHEST - 2 VIEW COMPARISON:  Comparison is made with April 19, 2008. FINDINGS: The heart size and mediastinal contours are within normal limits. Both lungs are clear. The visualized skeletal structures are unremarkable. IMPRESSION: No active cardiopulmonary disease. Electronically Signed   By: Donzetta Kohut M.D.   On: 09/02/2021 11:24    Procedures Procedures    Medications Ordered in ED Medications  fluticasone (FLONASE) 50 MCG/ACT nasal spray 2 spray (2 sprays Each Nare Given 09/02/21 1050)  albuterol (VENTOLIN HFA) 108 (90 Base) MCG/ACT inhaler 2 puff (has no administration in time range)  predniSONE (DELTASONE) tablet 60 mg (60 mg Oral Given 09/02/21 1016)  albuterol (PROVENTIL) (2.5 MG/3ML) 0.083% nebulizer solution 10 mg (10 mg Nebulization Given 09/02/21 1011)    ED Course/ Medical Decision Making/ A&P Clinical Course as of 09/02/21 1330  Fri Sep 02, 2021  1321 Reevaluation-sitting up edge of bed, no respiratory distress.  Lungs with improved air movement as compared to earlier, without audible wheezes rales or rhonchi.  No increased work of breathing.  No wheezing auscultated.  Patient coughs when breathing deeply, it is nonproductive.  I do encourage her to take deep breaths because of a sensation of chest pressure in the central chest, when breathing deeply.  [EW]    Clinical Course User Index [EW] Mancel Bale, MD                           Medical Decision Making Presents for failure to improve after being treated multiple times for asthma exacerbation.  Problems Addressed: Moderate persistent  asthma with exacerbation: undiagnosed new problem with uncertain prognosis    Details: Progressive illness with discolored sputum, and hypoxia while ambulating.  Amount and/or Complexity of Data Reviewed External Data Reviewed: notes.    Details: Urgent care notes indicate hypoxia at 90% on room air while ambulating today. Radiology: ordered and independent interpretation performed.    Details: Chest x-ray-no infiltrate, edema or atelectasis.  Risk Prescription drug management. Decision regarding hospitalization. Risk Details: Patient with marginal improvement after treatment using continuous albuterol nebulizer, prednisone and Flonase.  She has had ongoing asthma attack, currently under treatment as an outpatient.  I have very low suspicion for PE.  Vital signs are stable.  She is PERC negative.  Ambulatory oxygen with exertion of walking, and is 92% or greater.  Doubt pneumonia, CHF, acute bronchitis.  Discharged with prescriptions for prednisone, albuterol; and Flonase as well as an inhaler to go.           Final Clinical Impression(s) / ED Diagnoses Final diagnoses:  Moderate persistent asthma with exacerbation    Rx / DC Orders ED Discharge Orders     None         Mancel BaleWentz, Dagon Budai, MD 09/02/21 1336

## 2021-09-02 NOTE — ED Triage Notes (Signed)
Patient c/o a non productive cough and SOB tht has not worsened x 1 month.  Patient went to an UC today and was referred to the ED to r/o a "blood clot." Patient states she had a CXR completed at the UC.

## 2021-09-02 NOTE — ED Notes (Addendum)
Pt ambulated in hall and said she still felt a little short of breath but well enough to go home.

## 2021-09-02 NOTE — ED Provider Triage Note (Signed)
Emergency Medicine Provider Triage Evaluation Note  Erica Moody , a 21 y.o. female  was evaluated in triage.  Pt complains of cough, congestion, shortness of breath and earache.  Patient states her symptoms started about a month ago, went to urgent care last week and was started on steroids and given Augmentin.  Return to care today when she was not feeling any better.  Patient is out of her albuterol inhaler.  Was ambulated at the urgent care and found to have an ambulatory O2 sat of 90% and sent to the ER.  Review of Systems  Positive: Cough, congestion, body aches, earache, shortness of breath Negative: Fevers, chills, lower extremity swelling  Physical Exam  BP (!) 135/50 (BP Location: Left Arm)    Pulse (!) 107    Temp 97.6 F (36.4 C) (Oral)    Resp 18    Ht 5\' 8"  (1.727 m)    Wt 115.2 kg    SpO2 96%    BMI 38.62 kg/m  Gen:   Awake, no distress   Resp:  Normal effort  MSK:   Moves extremities without difficulty  Other:  Lung sounds diminished  Medical Decision Making  Medically screening exam initiated at 9:11 AM.  Appropriate orders placed.  Erica Moody was informed that the remainder of the evaluation will be completed by another provider, this initial triage assessment does not replace that evaluation, and the importance of remaining in the ED until their evaluation is complete.  Plan is to order neb treatment for diminished lung sounds with complaint of shortness of breath with URI symptoms   Maisie Fus, PA-C 09/02/21 09/04/21

## 2021-09-06 DIAGNOSIS — F411 Generalized anxiety disorder: Secondary | ICD-10-CM | POA: Diagnosis not present

## 2021-09-06 DIAGNOSIS — F4321 Adjustment disorder with depressed mood: Secondary | ICD-10-CM | POA: Diagnosis not present

## 2021-09-08 DIAGNOSIS — F323 Major depressive disorder, single episode, severe with psychotic features: Secondary | ICD-10-CM | POA: Diagnosis not present

## 2021-09-08 DIAGNOSIS — F411 Generalized anxiety disorder: Secondary | ICD-10-CM | POA: Diagnosis not present

## 2021-09-08 DIAGNOSIS — R0602 Shortness of breath: Secondary | ICD-10-CM | POA: Diagnosis not present

## 2021-09-08 DIAGNOSIS — F4010 Social phobia, unspecified: Secondary | ICD-10-CM | POA: Diagnosis not present

## 2021-09-09 ENCOUNTER — Emergency Department (HOSPITAL_COMMUNITY): Payer: Federal, State, Local not specified - PPO

## 2021-09-09 ENCOUNTER — Other Ambulatory Visit: Payer: Self-pay

## 2021-09-09 ENCOUNTER — Emergency Department (HOSPITAL_COMMUNITY)
Admission: EM | Admit: 2021-09-09 | Discharge: 2021-09-09 | Disposition: A | Discharge status: Home / Self Care | Payer: Federal, State, Local not specified - PPO | Attending: Emergency Medicine | Admitting: Emergency Medicine

## 2021-09-09 ENCOUNTER — Encounter (HOSPITAL_COMMUNITY): Payer: Self-pay

## 2021-09-09 DIAGNOSIS — Z7952 Long term (current) use of systemic steroids: Secondary | ICD-10-CM | POA: Diagnosis not present

## 2021-09-09 DIAGNOSIS — J188 Other pneumonia, unspecified organism: Secondary | ICD-10-CM | POA: Insufficient documentation

## 2021-09-09 DIAGNOSIS — J45909 Unspecified asthma, uncomplicated: Secondary | ICD-10-CM | POA: Insufficient documentation

## 2021-09-09 DIAGNOSIS — J9811 Atelectasis: Secondary | ICD-10-CM | POA: Diagnosis not present

## 2021-09-09 DIAGNOSIS — Z79899 Other long term (current) drug therapy: Secondary | ICD-10-CM | POA: Diagnosis not present

## 2021-09-09 DIAGNOSIS — Z20822 Contact with and (suspected) exposure to covid-19: Secondary | ICD-10-CM | POA: Diagnosis not present

## 2021-09-09 DIAGNOSIS — J189 Pneumonia, unspecified organism: Secondary | ICD-10-CM

## 2021-09-09 DIAGNOSIS — R0602 Shortness of breath: Secondary | ICD-10-CM | POA: Diagnosis not present

## 2021-09-09 LAB — CBC WITH DIFFERENTIAL/PLATELET
Abs Immature Granulocytes: 0.02 10*3/uL (ref 0.00–0.07)
Basophils Absolute: 0 10*3/uL (ref 0.0–0.1)
Basophils Relative: 0 %
Eosinophils Absolute: 0.1 10*3/uL (ref 0.0–0.5)
Eosinophils Relative: 2 %
HCT: 44.8 % (ref 36.0–46.0)
Hemoglobin: 13.8 g/dL (ref 12.0–15.0)
Immature Granulocytes: 0 %
Lymphocytes Relative: 35 %
Lymphs Abs: 2.8 10*3/uL (ref 0.7–4.0)
MCH: 25 pg — ABNORMAL LOW (ref 26.0–34.0)
MCHC: 30.8 g/dL (ref 30.0–36.0)
MCV: 81.3 fL (ref 80.0–100.0)
Monocytes Absolute: 0.8 10*3/uL (ref 0.1–1.0)
Monocytes Relative: 10 %
Neutro Abs: 4.3 10*3/uL (ref 1.7–7.7)
Neutrophils Relative %: 53 %
Platelets: 528 10*3/uL — ABNORMAL HIGH (ref 150–400)
RBC: 5.51 MIL/uL — ABNORMAL HIGH (ref 3.87–5.11)
RDW: 13.7 % (ref 11.5–15.5)
WBC: 8.1 10*3/uL (ref 4.0–10.5)
nRBC: 0 % (ref 0.0–0.2)

## 2021-09-09 LAB — COMPREHENSIVE METABOLIC PANEL
ALT: 61 U/L — ABNORMAL HIGH (ref 0–44)
AST: 25 U/L (ref 15–41)
Albumin: 3.3 g/dL — ABNORMAL LOW (ref 3.5–5.0)
Alkaline Phosphatase: 53 U/L (ref 38–126)
Anion gap: 10 (ref 5–15)
BUN: 11 mg/dL (ref 6–20)
CO2: 21 mmol/L — ABNORMAL LOW (ref 22–32)
Calcium: 8.9 mg/dL (ref 8.9–10.3)
Chloride: 104 mmol/L (ref 98–111)
Creatinine, Ser: 0.88 mg/dL (ref 0.44–1.00)
GFR, Estimated: >60 mL/min (ref >60)
Glucose, Bld: 85 mg/dL (ref 70–99)
Potassium: 3.9 mmol/L (ref 3.5–5.1)
Sodium: 135 mmol/L (ref 135–145)
Total Bilirubin: 0.2 mg/dL — ABNORMAL LOW (ref 0.3–1.2)
Total Protein: 7.2 g/dL (ref 6.5–8.1)

## 2021-09-09 LAB — RESP PANEL BY RT-PCR (FLU A&B, COVID) ARPGX2
Influenza A by PCR: NEGATIVE
Influenza B by PCR: NEGATIVE
SARS Coronavirus 2 by RT PCR: NEGATIVE

## 2021-09-09 LAB — D-DIMER, QUANTITATIVE: D-Dimer, Quant: 0.41 ug/mL-FEU (ref 0.00–0.50)

## 2021-09-09 LAB — HCG, SERUM, QUALITATIVE: Preg, Serum: NEGATIVE

## 2021-09-09 MED ORDER — DOXYCYCLINE HYCLATE 100 MG PO CAPS: 100.0000 mg | ORAL_CAPSULE | Freq: Two times a day (BID) | ORAL | 0 refills | Status: DC

## 2021-09-09 MED ORDER — SODIUM CHLORIDE (PF) 0.9 % IJ SOLN
INTRAMUSCULAR | Status: AC
  Filled 2021-09-09: qty 50

## 2021-09-09 MED ORDER — IOHEXOL 350 MG/ML SOLN
100.0000 mL | Freq: Once | INTRAVENOUS | Status: AC | PRN
  Administered 2021-09-09: 100 mL via INTRAVENOUS

## 2021-09-09 NOTE — Discharge Instructions (Signed)
You were seen in the emergency department for continued shortness of breath for 1 month.  Your oxygen level was good.  Your lab work was unremarkable but you had a CAT scan that showed some possible pneumonia.  We have started you on some antibiotics.  Please follow-up with family practice and pulmonology as scheduled.

## 2021-09-09 NOTE — ED Provider Triage Note (Signed)
Emergency Medicine Provider Triage Evaluation Note  Erica Moody , a 21 y.o. female  was evaluated in triage.  Pt complains of SOB ongoing for the past month. The patient has been seen in the ER and at [facility] multiple times, yesterday she was called by UC that she had an elevated D-dimer and was sent for an outpatient CTA. The patient presented here for further evaluation. She reports some chest tightness but denies any pain. Reports cough and cold symptoms. Denies any leg swelling. Reports OCP use.  Review of Systems  Positive: SOB, cough, congestion, chest tightness Negative: Leg swelling, lightheadedness  Physical Exam  BP (!) 152/88 (BP Location: Right Arm)    Pulse 83    Temp 98.6 F (37 C) (Oral)    Resp 18    Ht 5\' 8"  (1.727 m)    Wt 115.2 kg    SpO2 95%    BMI 38.62 kg/m  Gen:   Awake, no distress   Resp:  Normal effort, mild wheezing. Speaking in full sentences with ease.   MSK:   Moves extremities without difficulty  Other:    Medical Decision Making  Medically screening exam initiated at 10:05 AM.  Appropriate orders placed.  Erica Moody was informed that the remainder of the evaluation will be completed by another provider, this initial triage assessment does not replace that evaluation, and the importance of remaining in the ED until their evaluation is complete.  No tahcypnea. The patient is speaking in full sentences with ease. Given elevated D-dimer as well as continuation of symptoms and failed outpatient therapy, will order basic labs and CTA chest.    Maisie Fus, PA-C 09/09/21 1011

## 2021-09-09 NOTE — ED Provider Notes (Signed)
[facility]-EMERGENCY DEPT Provider Note   CSN: [id number] Arrival date & time: 09/09/21  4818     History  Chief Complaint  Patient presents with   Shortness of Breath   Abnormal Lab    Delaynie A Revolorio is a 21 y.o. female.  She has a history of asthma.  Complaining of shortness of breath and cough that has been going on for about a month.  It sounds like she has had a course of steroids and antibiotics.  She went to urgent care yesterday where her D-dimer was elevated and was recommended that she come get a CAT scan.  She denies any chest pain.  She does not have a primary care doctor.  The history is provided by the patient.  Shortness of Breath Severity:  Moderate Onset quality:  Gradual Duration:  1 month Timing:  Constant Progression:  Unchanged Chronicity:  New Relieved by:  Nothing Worsened by:  Activity Ineffective treatments:  Inhaler Associated symptoms: cough, sputum production (white) and wheezing   Associated symptoms: no abdominal pain, no chest pain, no fever, no headaches, no hemoptysis, no neck pain, no rash and no sore throat   Risk factors: no tobacco use   Abnormal Lab     Home Medications Prior to Admission medications   Medication Sig Start Date End Date Taking? Authorizing Provider  albuterol (VENTOLIN HFA) 108 (90 Base) MCG/ACT inhaler Inhale 2 puffs into the lungs every 4 (four) hours as needed for wheezing or shortness of breath. 09/02/21   Mancel Bale, MD  cetirizine (ZYRTEC) 10 MG tablet Take 1 tablet (10 mg total) by mouth daily. 04/24/16 04/08/20  Georgiann Hahn, MD  ibuprofen (ADVIL,MOTRIN) 800 MG tablet Take 1 tablet (800 mg total) by mouth every 8 (eight) hours as needed for moderate pain. 12/23/15   Hess, Nada Boozer, PA-C  Multiple Vitamins-Minerals (MULTIVITAMIN PO) Take by mouth.    [provider]  norethindrone-ethinyl estradiol (JUNEL FE 1/20) 1-20 MG-MCG tablet Take 1 tablet by mouth daily. 07/23/20   Henson,  Vickie L, PA-C  predniSONE (DELTASONE) 10 MG tablet Take q day 6,5,4,3,2,1 09/02/21   Mancel Bale, MD  tretinoin (RETIN-A) 0.025 % cream SMARTSIG:Sparingly Topical Every Evening 05/11/20   [provider]  triamcinolone ointment (KENALOG) 0.1 % Apply topically. 05/11/20   [provider]      Allergies    Pineapple    Review of Systems   Review of Systems  Constitutional:  Negative for fever.  HENT:  Negative for sore throat.   Eyes:  Negative for visual disturbance.  Respiratory:  Positive for cough, sputum production (white), shortness of breath and wheezing. Negative for hemoptysis.   Cardiovascular:  Negative for chest pain.  Gastrointestinal:  Negative for abdominal pain.  Genitourinary:  Negative for dysuria.  Musculoskeletal:  Negative for neck pain.  Skin:  Negative for rash.  Neurological:  Negative for headaches.   Physical Exam Updated Vital Signs BP (!) 152/88 (BP Location: Right Arm)    Pulse 83    Temp 98.6 F (37 C) (Oral)    Resp 18    Ht 5\' 8"  (1.727 m)    Wt 115.2 kg    SpO2 95%    BMI 38.62 kg/m  Physical Exam Vitals and nursing note reviewed.  Constitutional:      General: She is not in acute distress.    Appearance: She is well-developed.  HENT:     Head: Normocephalic and atraumatic.  Eyes:  Conjunctiva/sclera: Conjunctivae normal.  Cardiovascular:     Rate and Rhythm: Normal rate and regular rhythm.     Heart sounds: No murmur heard. Pulmonary:     Effort: Pulmonary effort is normal. No respiratory distress.     Breath sounds: Normal breath sounds.  Abdominal:     Palpations: Abdomen is soft.     Tenderness: There is no abdominal tenderness.  Musculoskeletal:        General: No swelling. Normal range of motion.     Cervical back: Neck supple.     Right lower leg: No tenderness. No edema.     Left lower leg: No tenderness. No edema.  Skin:    General: Skin is warm and dry.     Capillary Refill: Capillary refill takes less  than 2 seconds.  Neurological:     General: No focal deficit present.     Mental Status: She is alert.    ED Results / Procedures / Treatments   Labs (all labs ordered are listed, but only abnormal results are displayed) Labs Reviewed  COMPREHENSIVE METABOLIC PANEL - Abnormal; Notable for the following components:      Result Value   CO2 21 (*)    Albumin 3.3 (*)    ALT 61 (*)    Total Bilirubin 0.2 (*)    All other components within normal limits  CBC WITH DIFFERENTIAL/PLATELET - Abnormal; Notable for the following components:   RBC 5.51 (*)    MCH 25.0 (*)    Platelets 528 (*)    All other components within normal limits  RESP PANEL BY RT-PCR (FLU A&B, COVID) [id number]  D-DIMER, QUANTITATIVE  HCG, SERUM, QUALITATIVE    EKG None  Radiology CT Angio Chest Pulmonary Embolism (PE) W or WO Contrast  Result Date: 09/09/2021 CLINICAL DATA:  Shortness of breath EXAM: CT ANGIOGRAPHY CHEST WITH CONTRAST TECHNIQUE: Multidetector CT imaging of the chest was performed using the standard protocol during bolus administration of intravenous contrast. Multiplanar CT image reconstructions and MIPs were obtained to evaluate the vascular anatomy. RADIATION DOSE REDUCTION: This exam was performed according to the departmental dose-optimization program which includes automated exposure control, adjustment of the mA and/or kV according to patient size and/or use of iterative reconstruction technique. CONTRAST:  19mL OMNIPAQUE IOHEXOL 350 MG/ML SOLN COMPARISON:  None. FINDINGS: Cardiovascular: No definite pulmonary embolism identified. Limited evaluation due to breathing motion. Main pulmonary artery is normal caliber. Heart size is normal. No pericardial effusion identified. Thoracic aorta is normal in course and caliber. Mediastinum/Nodes: A few mildly enlarged lymph nodes identified in the bilateral hila measuring up to 13 mm on the right. No mediastinal or axillary lymphadenopathy identified.  Lungs/Pleura: Small bands of subsegmental atelectasis identified in the lingula and right lower lobe at the lung base. Mild opacities in the right middle lobe which may represent atelectasis or possibly early infiltrate. No pleural effusion or pneumothorax. Upper Abdomen: No acute abnormality. Musculoskeletal: No chest wall abnormality. No acute or significant osseous findings. Review of the MIP images confirms the above findings. IMPRESSION: 1. No pulmonary embolism identified. Somewhat limited evaluation due to breathing motion. 2. Mild opacities in the right middle lobe which may represent subsegmental atelectasis or possibly early infiltrate. Bands of subsegmental atelectasis in the lingula and right lower lobe. 3. A few mildly enlarged hilar lymph nodes. Electronically Signed   By: Ofilia Neas M.D.   On: 09/09/2021 12:30    Procedures Procedures    Medications Ordered in ED Medications  iohexol (OMNIPAQUE) 350 MG/ML injection 100 mL (100 mLs Intravenous Contrast Given 09/09/21 1203)  sodium chloride (PF) 0.9 % injection (  Given by Other 09/09/21 1241)    ED Course/ Medical Decision Making/ A&P                           Medical Decision Making Risk Prescription drug management.  Jensyn A Pavlovic was evaluated in Emergency Department on 09/09/2021 for the symptoms described in the history of present illness. She was evaluated in the context of the global COVID-19 pandemic, which necessitated consideration that the patient might be at risk for infection with the SARS-CoV-2 virus that causes COVID-19. Institutional protocols and algorithms that pertain to the evaluation of patients at risk for COVID-19 are in a state of rapid change based on information released by regulatory bodies including the CDC and federal and state organizations. These policies and algorithms were followed during the patient's care in the ED.  This patient complains of shortness of breath x1 month; this involves an  extensive number of treatment Options and is a complaint that carries with it a high risk of complications and Morbidity. The differential includes COVID, flu, pneumonia, bronchitis anxiety, anemia  I ordered, reviewed and interpreted labs, which included CBC with normal white count normal hemoglobin, chemistries normal other than mildly low bicarb, COVID and flu negative, D-dimer negative, pregnancy test negative I ordered medication IV fluids I ordered imaging studies which included CT angio chest and I independently    visualized and interpreted imaging which showed no evidence of PE.  Does have some groundglass opacities question infiltrate Additional history obtained from epic including prior ED visits and urgent care visits  After the interventions stated above, I reevaluated the patient and found patient to be satting well on room air.  No respiratory distress.  Will cover with antibiotics for possible infiltrate.  Recommended close follow-up with PCP and pulmonology          Final Clinical Impression(s) / ED Diagnoses Final diagnoses:  Community acquired pneumonia, unspecified laterality  Shortness of breath    Rx / DC Orders ED Discharge Orders          Ordered    doxycycline (VIBRAMYCIN) 100 MG capsule  2 times daily        09/09/21 1252              Hayden Rasmussen, MD 09/09/21 [phone]

## 2021-09-09 NOTE — ED Triage Notes (Signed)
Patient c/o of SOB and sent from urgent care. pAtient states they sent her here for an elevated d-dimer

## 2021-09-20 DIAGNOSIS — F4321 Adjustment disorder with depressed mood: Secondary | ICD-10-CM | POA: Diagnosis not present

## 2021-09-20 DIAGNOSIS — F411 Generalized anxiety disorder: Secondary | ICD-10-CM | POA: Diagnosis not present

## 2021-09-22 ENCOUNTER — Institutional Professional Consult (permissible substitution): Payer: Federal, State, Local not specified - PPO | Admitting: Pulmonary Disease

## 2021-09-22 DIAGNOSIS — F4010 Social phobia, unspecified: Secondary | ICD-10-CM | POA: Diagnosis not present

## 2021-09-22 DIAGNOSIS — F411 Generalized anxiety disorder: Secondary | ICD-10-CM | POA: Diagnosis not present

## 2021-09-22 DIAGNOSIS — F323 Major depressive disorder, single episode, severe with psychotic features: Secondary | ICD-10-CM | POA: Diagnosis not present

## 2021-09-23 ENCOUNTER — Encounter: Payer: Self-pay | Admitting: Medical-Surgical

## 2021-09-23 ENCOUNTER — Ambulatory Visit (INDEPENDENT_AMBULATORY_CARE_PROVIDER_SITE_OTHER): Payer: Federal, State, Local not specified - PPO | Admitting: Medical-Surgical

## 2021-09-23 ENCOUNTER — Other Ambulatory Visit: Payer: Self-pay

## 2021-09-23 VITALS — BP 140/80 | HR 70 | Resp 20 | Ht 68.0 in | Wt 267.9 lb

## 2021-09-23 DIAGNOSIS — H6592 Unspecified nonsuppurative otitis media, left ear: Secondary | ICD-10-CM

## 2021-09-23 DIAGNOSIS — Z7689 Persons encountering health services in other specified circumstances: Secondary | ICD-10-CM | POA: Diagnosis not present

## 2021-09-23 DIAGNOSIS — F649 Gender identity disorder, unspecified: Secondary | ICD-10-CM | POA: Diagnosis not present

## 2021-09-23 DIAGNOSIS — F419 Anxiety disorder, unspecified: Secondary | ICD-10-CM

## 2021-09-23 DIAGNOSIS — F32A Depression, unspecified: Secondary | ICD-10-CM | POA: Diagnosis not present

## 2021-09-23 MED ORDER — ALBUTEROL SULFATE HFA 108 (90 BASE) MCG/ACT IN AERS: 2.0000 | INHALATION_SPRAY | RESPIRATORY_TRACT | 0 refills | Status: DC | PRN

## 2021-09-23 NOTE — Progress Notes (Addendum)
New Patient Office Visit  Subjective:  Patient ID: Erica Moody, female    DOB: 17-Sep-2000  Age: 21 y.o. MRN: [id number]  CC: establish care  HPI [name] presents for establish care. Pleasant 21 y.o. transgender female. Preferred name "Erica Moody." Would like to discuss testosterone therapy. Is seeing a therapist and a psychiatrist. Was started on Sertraline a month ago for anxiety/depression and states that is going well with no side effects. Started on Quetiapine $RemoveBefor'25mg'vtedQKFMWRYS$   two weeks ago for anxiety, sleep, and hallucinations and states that it is helping, he is sleeping better and he has had no side effects. Denies SI/HI.   Has been taking norethindrone-ethinyl estradiol for the past year for periods. Started it due to having period for 100 days and reports having regular periods now with no concerns.   Had pneumonia recently and has been on antibiotics for 20 days. Is still having trouble with left ear. Reports hearing going in and out and will occasionally hear popping sound.  Past Medical History:  Diagnosis Date   Allergy    Asthma    as a child   Obesity    Prediabetes 04/10/2020    Past Surgical History:  Procedure Laterality Date   ADENOIDECTOMY     2003   TONSILLECTOMY     2004    Family History  Problem Relation Age of Onset   Hypertension Mother    Heart disease Father    Hypertension Father    Hyperlipidemia Father    Migraines Father    Arthritis Maternal Grandmother    Heart disease Maternal Grandfather 99       heart anuresym   Hypertension Maternal Grandfather    Early death Maternal Grandfather    Cancer Paternal Grandfather    Alcohol abuse Neg Hx    Asthma Neg Hx    COPD Neg Hx    Birth defects Neg Hx    Depression Neg Hx    Diabetes Neg Hx    Drug abuse Neg Hx    Kidney disease Neg Hx    Mental illness Neg Hx    Stroke Neg Hx    Seizures Neg Hx    Thyroid disease Neg Hx    Vision loss Neg Hx    Varicose Veins Neg Hx    Miscarriages / Stillbirths  Neg Hx    Mental retardation Neg Hx    Learning disabilities Neg Hx     Social History   Socioeconomic History   Marital status: Single    Spouse name: Not on file   Number of children: Not on file   Years of education: Not on file   Highest education level: Not on file  Occupational History   Not on file  Tobacco Use   Smoking status: Never   Smokeless tobacco: Never  Vaping Use   Vaping Use: Never used  Substance and Sexual Activity   Alcohol use: Yes    Comment: social   Drug use: No   Sexual activity: Never  Other Topics Concern   Not on file  Social History Narrative   Lives with mother, step dad, step brother and cousin, Elmarie Shiley   Stays a lot with grandmothers during the summer while mother works overtime      10th grade at [employer]- poetry   Social Determinants of Health   Financial Resource Strain: Not on file  Food Insecurity: Not on file  Transportation Needs: Not on file  Physical  Activity: Not on file  Stress: Not on file  Social Connections: Not on file  Intimate Partner Violence: Not on file    Physical Exam Constitutional:      Appearance: Normal appearance.  HENT:     Head: Normocephalic and atraumatic.     Right Ear: Tympanic membrane normal.     Left Ear: Tympanic membrane normal. Drainage present.     Ears:     Comments: effusion Eyes:     Extraocular Movements: Extraocular movements intact.  Pulmonary:     Effort: Pulmonary effort is normal.  Skin:    General: Skin is warm and dry.  Neurological:     Mental Status: She is oriented to person, place, and time.  Psychiatric:        Mood and Affect: Mood normal.        Behavior: Behavior normal.     Objective:   Today's Vitals: LMP 09/07/2021     Assessment & Plan:   1. Encounter to establish care Reviewed available information and discussed care concerns with patient.    2. Gender dysphoria -Would like to start testosterone. Discussed contraindications and criteria  for starting hormone therapy: Persistent, well-documented gender incongruence +/- gender dysphoria, capacity to make a well-informed decision (principles of informed consent), Age of 24 (46+ in [place]), Relevant medical and/or mental health issues are well controlled - Advised to talk to psychiatry for letter of clearance to start hormone therapy. Discussed stopping birth control, d/t history of irregular periods, would like to stay on OCP until starting hormone therapy.    3. Middle ear effusion, left Takes claritin daily, discussed switching to xyzal or zyrtec to see if more effective due to being on claritin for an extended period of time. Encouraged to try flonase two times a day for the next 7 days.  4. Anxiety and depression Doing well on Sertraline $RemoveBefor'75mg'RHlItVEYWoAC$  and Quetiapine $RemoveBefore'25mg'yXCSvEOFBOadY$ . Denies side effects, SI or HI. Being seen by psychiatry and sees a Social worker. Psychiatry increased quetiapine to $RemoveBefor'50mg'SgEppTWXfHCm$ , which she has not started yet. Continue med regimen and counseling.  Outpatient Encounter Medications as of 09/23/2021  Medication Sig   albuterol (VENTOLIN HFA) 108 (90 Base) MCG/ACT inhaler Inhale 2 puffs into the lungs every 4 (four) hours as needed for wheezing or shortness of breath.   cetirizine (ZYRTEC) 10 MG tablet Take 1 tablet (10 mg total) by mouth daily.   doxycycline (VIBRAMYCIN) 100 MG capsule Take 1 capsule (100 mg total) by mouth 2 (two) times daily.   ibuprofen (ADVIL,MOTRIN) 800 MG tablet Take 1 tablet (800 mg total) by mouth every 8 (eight) hours as needed for moderate pain.   Multiple Vitamins-Minerals (MULTIVITAMIN PO) Take by mouth.   norethindrone-ethinyl estradiol (JUNEL FE 1/20) 1-20 MG-MCG tablet Take 1 tablet by mouth daily.   predniSONE (DELTASONE) 10 MG tablet Take q day 6,5,4,3,2,1   tretinoin (RETIN-A) 0.025 % cream SMARTSIG:Sparingly Topical Every Evening   triamcinolone ointment (KENALOG) 0.1 % Apply topically.   [DISCONTINUED] albuterol (VENTOLIN HFA) 108 (90 Base)  MCG/ACT inhaler Inhale 2 puffs into the lungs every 4 (four) hours as needed for wheezing or shortness of breath.   No facility-administered encounter medications on file as of 09/23/2021.    Follow-up: Return for discussion of hormone therapy initiation once psych clears.   Jeanann Lewandowsky, Student NP

## 2021-09-23 NOTE — Progress Notes (Deleted)
New Patient Office Visit  Subjective:  Patient ID: Erica Moody, female    DOB: 2001-02-11  Age: 21 y.o. MRN: [id number]  CC: No chief complaint on file.    HPI Erica Moody presents to establish care.   Past Medical History:  Diagnosis Date   Allergy    Asthma    as a child   Obesity    Prediabetes 04/10/2020    Past Surgical History:  Procedure Laterality Date   ADENOIDECTOMY     2003   TONSILLECTOMY     2004    Family History  Problem Relation Age of Onset   Hypertension Mother    Heart disease Father    Hypertension Father    Hyperlipidemia Father    Migraines Father    Arthritis Maternal Grandmother    Heart disease Maternal Grandfather 31       heart anuresym   Hypertension Maternal Grandfather    Early death Maternal Grandfather    Cancer Paternal Grandfather    Alcohol abuse Neg Hx    Asthma Neg Hx    COPD Neg Hx    Birth defects Neg Hx    Depression Neg Hx    Diabetes Neg Hx    Drug abuse Neg Hx    Kidney disease Neg Hx    Mental illness Neg Hx    Stroke Neg Hx    Seizures Neg Hx    Thyroid disease Neg Hx    Vision loss Neg Hx    Varicose Veins Neg Hx    Miscarriages / Stillbirths Neg Hx    Mental retardation Neg Hx    Learning disabilities Neg Hx     Social History   Socioeconomic History   Marital status: Single    Spouse name: Not on file   Number of children: Not on file   Years of education: Not on file   Highest education level: Not on file  Occupational History   Not on file  Tobacco Use   Smoking status: Never   Smokeless tobacco: Never  Vaping Use   Vaping Use: Never used  Substance and Sexual Activity   Alcohol use: Yes    Comment: social   Drug use: No   Sexual activity: Never  Other Topics Concern   Not on file  Social History Narrative   Lives with mother, step dad, step brother and cousin, Maloof Hoff   Stays a lot with grandmothers during the summer while mother works  overtime      10th grade at [employer]- poetry   Social Determinants of Health   Financial Resource Strain: Not on file  Food Insecurity: Not on file  Transportation Needs: Not on file  Physical Activity: Not on file  Stress: Not on file  Social Connections: Not on file  Intimate Partner Violence: Not on file    ROS Review of Systems  Objective:   Today's Vitals: LMP 09/07/2021   Physical Exam  Assessment & Plan:   There are no diagnoses linked to this encounter.  Outpatient Encounter Medications as of 09/23/2021  Medication Sig   albuterol (VENTOLIN HFA) 108 (90 Base) MCG/ACT inhaler Inhale 2 puffs into the lungs every 4 (four) hours as needed for wheezing or shortness of breath.   cetirizine (ZYRTEC) 10 MG tablet Take 1 tablet (10 mg total) by mouth daily.   doxycycline (VIBRAMYCIN) 100 MG capsule Take 1 capsule (100 mg total) by mouth 2 (two) times daily.  ibuprofen (ADVIL,MOTRIN) 800 MG tablet Take 1 tablet (800 mg total) by mouth every 8 (eight) hours as needed for moderate pain.   Multiple Vitamins-Minerals (MULTIVITAMIN PO) Take by mouth.   norethindrone-ethinyl estradiol (JUNEL FE 1/20) 1-20 MG-MCG tablet Take 1 tablet by mouth daily.   predniSONE (DELTASONE) 10 MG tablet Take q day 6,5,4,3,2,1   tretinoin (RETIN-A) 0.025 % cream SMARTSIG:Sparingly Topical Every Evening   triamcinolone ointment (KENALOG) 0.1 % Apply topically.   No facility-administered encounter medications on file as of 09/23/2021.    Follow-up: No follow-ups on file.   Clearnce Sorrel, DNP, APRN, FNP-BC [facility]

## 2021-09-23 NOTE — Progress Notes (Signed)
Medical screening examination/treatment was performed by qualified nurse practitioner student and as supervising provider I was immediately available for consultation/collaboration. I have reviewed documentation and agree with assessment and plan. ° °Yakelin Grenier L. Torrance Frech, DNP, APRN, FNP-BC °[facility] °Primary Care and Sports Medicine ° °

## 2021-10-04 DIAGNOSIS — F4321 Adjustment disorder with depressed mood: Secondary | ICD-10-CM | POA: Diagnosis not present

## 2021-10-04 DIAGNOSIS — F411 Generalized anxiety disorder: Secondary | ICD-10-CM | POA: Diagnosis not present

## 2021-10-13 DIAGNOSIS — F4010 Social phobia, unspecified: Secondary | ICD-10-CM | POA: Diagnosis not present

## 2021-10-13 DIAGNOSIS — F411 Generalized anxiety disorder: Secondary | ICD-10-CM | POA: Diagnosis not present

## 2021-10-13 DIAGNOSIS — F323 Major depressive disorder, single episode, severe with psychotic features: Secondary | ICD-10-CM | POA: Diagnosis not present

## 2021-10-18 ENCOUNTER — Other Ambulatory Visit: Payer: Self-pay | Admitting: Medical-Surgical

## 2021-10-18 DIAGNOSIS — F411 Generalized anxiety disorder: Secondary | ICD-10-CM | POA: Diagnosis not present

## 2021-10-18 DIAGNOSIS — F4321 Adjustment disorder with depressed mood: Secondary | ICD-10-CM | POA: Diagnosis not present

## 2021-10-19 DIAGNOSIS — L732 Hidradenitis suppurativa: Secondary | ICD-10-CM | POA: Diagnosis not present

## 2021-10-19 NOTE — Progress Notes (Signed)
 Erica Moody is a 21 y.o.  with  reports that she has never smoked. She has never used smokeless tobacco. with  Active Ambulatory Problems    Diagnosis Date Noted   Abscess of right axilla 2020-04-17   Acne, mild 02/20/2013   Anxiety and depression 10/07/2020   Cellulitis of axilla, left 07-01-16   Death of parent April 17, 2020   Elevated blood pressure reading 02/20/2013   Elevated hemoglobin A1c 04/17/2020   Hidradenitis suppurativa 07/23/2020   Menorrhagia with regular cycle 07/23/2020   Mild intermittent asthma without complication 2020/04/17   Obesity (BMI 30-39.9) 02/22/2016   Prediabetes 04/10/2020   Prolonged menstrual cycle 10/07/2020   Sebaceous cyst of right axilla 10/02/2017   Sore throat 04/24/2016   Viral pharyngitis 04/24/2016   Resolved Ambulatory Problems    Diagnosis Date Noted   No Resolved Ambulatory Problems   Past Medical History:  Diagnosis Date   Anxiety    Depression    who presents today at Urgent Care for  Chief Complaint  Patient presents with   Cyst    Cyst in right armpit. Hot to touch and painfully. Appeared about a week ago.      HPI  Erica Moody is a very pleasant 21 year old with history of hidradenitis who presents today with pain and swelling in the right axilla x1 week.  She has had drainage of a cyst in the axilla once in the past.  She is followed by dermatology.   Patient has no co-morbidities for developing severe infection.  Patient is on no medications and has no other medical conditions that would possibly reduce immunity.     reports that she has never smoked. She has never used smokeless tobacco.  Review of Systems ROS  Pain and swelling right axilla, no fever, chills, sweats.   Physical Exam  BP 142/94   Pulse 76   Temp 98.9 F (37.2 C) (Oral)   Resp 18   Ht 1.727 m (5' 8)   Wt 113.4 kg (250 lb)   SpO2 99%   BMI 38.01 kg/m  During this patient encounter, the patient was wearing a  mask.  Throughout this encounter, I was wearing at least a surgical mask.  I was not within 6 feet of this patient for more than 15 minutes without eye protection when they were not wearing mask.   Constitutional:      General: Patient is not in acute distress.    Appearance: Normal appearance.  Incision And Drainage  Date/Time: 10/19/2021 8:56 AM Performed by: Thom Prentice Mechanic, PA-C Authorized by: Thom Prentice Mechanic, PA-C  Type: abscess Body area: upper extremity Location details: right arm Anesthesia: local infiltration  Anesthesia: Local Anesthetic: lidocaine  1% without epinephrine   Sedation: Patient sedated: no  Risk factor: underlying major vessel and  underlying major nerve Scalpel size: 15 Needle gauge: 22 Incision type: single straight Incision depth: dermal Complexity: simple Drainage: purulent Drainage amount: copious Wound treatment: wound left open Packing material: none Comments: Patient was prepped and draped in the normal fashion. Right axillary cyst identified and confirmed.  Cyst measures approximately 1cm in diameter and there is surrounding induration and tenderness in an area measuring approximately 6cm.   1cc lidocaine  without epi used for local infiltration.  After adequate anesthesia, #15 blade used to make a 1cm incision.  Approximately 3 cc of pus readily drained, and an additional 1 cc is expressed from the cyst.  Clean dressing is applied.  Patient tolerated the procedure well  with no immediate complications.         No orders to display    Orders Placed This Encounter  Procedures   Incision And Drainage    This order was created via procedure documentation     DIAGNOSIS/PLAN   Urgent Care Follow Up: Home Care   1. Axillary hidradenitis suppurativa        Incision and drainage performed as above with immediate relief of patient's symptoms.  Due to the purulent nature of the drainage, will start on antibiotic therapy.  Patient  states she cannot tolerate doxycycline  due to GI side effects.  We will start Keflex .  Wound care is discussed in detail.  She can follow-up here if needed or follow-up with her dermatologist who has been managing this problem previously.     We discussed risks and side effects of medications, and also discussed red flags which would warrant immediate follow-up.                  Electronically signed by: Thom Prentice Mechanic, PA-C 10/19/21 [phone]

## 2021-10-26 DIAGNOSIS — F323 Major depressive disorder, single episode, severe with psychotic features: Secondary | ICD-10-CM | POA: Diagnosis not present

## 2021-10-27 DIAGNOSIS — F323 Major depressive disorder, single episode, severe with psychotic features: Secondary | ICD-10-CM | POA: Diagnosis not present

## 2021-10-27 DIAGNOSIS — F411 Generalized anxiety disorder: Secondary | ICD-10-CM | POA: Diagnosis not present

## 2021-10-27 DIAGNOSIS — F4010 Social phobia, unspecified: Secondary | ICD-10-CM | POA: Diagnosis not present

## 2021-11-03 ENCOUNTER — Encounter: Payer: Self-pay | Admitting: Medical-Surgical

## 2021-11-03 ENCOUNTER — Ambulatory Visit: Payer: Federal, State, Local not specified - PPO | Admitting: Medical-Surgical

## 2021-11-03 VITALS — BP 138/89 | HR 97 | Resp 20 | Ht 68.0 in | Wt 268.6 lb

## 2021-11-03 DIAGNOSIS — Z23 Encounter for immunization: Secondary | ICD-10-CM

## 2021-11-03 DIAGNOSIS — Z789 Other specified health status: Secondary | ICD-10-CM

## 2021-11-03 MED ORDER — TESTOSTERONE CYPIONATE 200 MG/ML IM SOLN
100.0000 mg | Freq: Once | INTRAMUSCULAR | Status: AC
  Administered 2021-11-03: 100 mg via INTRAMUSCULAR

## 2021-11-03 NOTE — Progress Notes (Signed)
Erica "Kit" is a 21 y.o. adult who presents to  ?[facility] at [facility]  ?today, 11/03/21, seeking care for the following: ? ?Start gender affirming hormones ? ?BP 138/89 (BP Location: Right Arm, Cuff Size: Large)   Pulse 97   Resp 20   Ht $R'5\' 8"'zX$  (1.727 m)   Wt 268 lb 9.6 oz (121.8 kg)   SpO2 97%   BMI 40.84 kg/m?  ? ?Background: ?Patient is a 21 y.o. adult who was AFAB  ?Patient identifies as a transgender man / transgender woman / nonbinary / other gender nonconforming person and has identified as such since approximately age 24 years. ?Patient has been evaluated by a mental health professional for gender dysphoria and provided a copy of a letter from this professional confirming diagnosis of gender dysphoria and confirming no other mental health concerns / other mental health conditions are well-controlled.  ?Patient is not current and has never been sexually active. Prefers female/sperm producing partners but currently describes self as Asexual.  ?Patient is not currently taking gender-affirming hormone therapy. ?Note: Criteria for starting hormone therapy (WPATH):  ?Persistent, well-documented gender incongruence +/- gender dysphoria- Met ?Capacity to make a well-informed decision (principles of informed consent)- Met ?Age of 30 (65+ in [place])- Met ?Relevant medical and/or mental health issues are well controlled- cleared by Psychiatry, letter scanned into Media, continuing counseling and medication ? ?Patient has undergone gender-affirming surgical procedure(s).  ?Patient does desire surgery in the future  ?Patient does not desire fertility. Patient is aware that hormone therapy is NOT contraception.  ? ?Lab values and routine screenings: ?On masculinizing hormones: ?Goal Testosterone 400-800 ?If on injections, can measure peak/trough right after and before T injections OR can measure mid-way between injections  ?Goal Estrogen 100-200  ?can stop measuring  estrogen is menses have ceased  ?Monitor CBC/Hgb ?mild elevation in RBC/Hgb is acceptable but severe polycythemia requires reduction in testosterone dosage and/or therapeutic phlebotomy ?Monitor Lipids, particularly LDL ?Breast tissue present: mammogram at age 73+ ?Cervix present: Pap as usual per ASCCP/ACOG guidelines  ?Bone density testing age 65+ or sooner if patient has not been adherent to hormone therapy  ? ? ?RESOURCES: ?WPATH Standards of Care: https://www.tandfonline.com/doi/pdf/10.1080/26895269.2022.2100644?cookieSet=1 ?Transline consultation service: https://transline.http://www.duncan.com/ ? ?Review of Systems  ?Constitutional:  Negative for chills, fever and malaise/fatigue.  ?Respiratory:  Negative for cough, shortness of breath and wheezing.   ?Cardiovascular:  Negative for chest pain, palpitations and leg swelling.  ?Psychiatric/Behavioral:  Positive for depression (Managed by psychiatry/counseling. Stable). Negative for suicidal ideas. The patient is nervous/anxious (Managed by psychiatry/counseling. Stable.).   ? ?Physical Exam ?Vitals and nursing note reviewed.  ?Constitutional:   ?   General: He is not in acute distress. ?   Appearance: Normal appearance. He is obese. He is not ill-appearing.  ?HENT:  ?   Head: Normocephalic and atraumatic.  ?Cardiovascular:  ?   Rate and Rhythm: Normal rate and regular rhythm.  ?   Pulses: Normal pulses.  ?   Heart sounds: Normal heart sounds. No murmur heard. ?  No friction rub. No gallop.  ?Pulmonary:  ?   Effort: Pulmonary effort is normal. No respiratory distress.  ?   Breath sounds: Normal breath sounds.  ?Skin: ?   General: Skin is warm and dry.  ?Neurological:  ?   Mental Status: He is alert and oriented to person, place, and time.  ?Psychiatric:     ?   Mood and Affect: Mood normal.     ?  Behavior: Behavior normal.     ?   Thought Content: Thought content normal.     ?   Judgment: Judgment normal.  ? ? ?ASSESSMENT & PLAN ?with other pertinent  findings: ? ?1. Female-to-female transgender person ?Patient has well documented gender dysphoria and has been evaluated by psychiatry. He continues to receive counseling for mental health concerns and is compliant with medications. He has done significant research into testosterone options and would like to do injectable testosterone. Feels he will be able to do self-injections. Discussed recommendations for monitoring and requirement for follow up compliance. He is agreeable to the necessary lab draws and follow ups so we will go ahead and start HRT today with $RemoveBe'100mg'iZEmeEXju$  testosterone cypionate IM x 1 in office then every 2 weeks. He will return in 2 weeks for a nurse visit for teaching to self-inject. Plan to check labs 1 week after 3rd injection. Discussed the need for birth control should he become sexually active with a sperm producing partner. He agrees to reach out to our office should the need arise. Verbalized understanding of the need for condom use to prevent pregnancy and STIs.  ?- testosterone cypionate (DEPOTESTOSTERONE CYPIONATE) injection 100 mg ? ?2. Immunization due ?Updating meningitis vaccines today.  ?- MENINGOCOCCAL MCV4O ?- Meningococcal B, OMV ? ?Patient Instructions  ?Testosterone Injection ?What is this medication? ?TESTOSTERONE (tes TOS ter one) is used to increase testosterone levels in your body. It belongs to a group of medications called androgen hormones. ?This medicine may be used for other purposes; ask your health care provider or pharmacist if you have questions. ?COMMON BRAND NAME(S): Andro-L.A., Aveed, Delatestryl, Depo-Testosterone, Virilon ?What should I tell my care team before I take this medication? ?They need to know if you have any of these conditions: ?Cancer ?Diabetes ?Heart disease ?Kidney disease ?Liver disease ?Lung disease ?Prostate disease ?An unusual or allergic reaction to testosterone, other medications, foods, dyes, or preservatives ?If a female partner is pregnant or  trying to get pregnant ?Breast-feeding ?How should I use this medication? ?This medication is for injection into a muscle. It is usually given in a hospital or clinic. ?A special MedGuide will be given to you by the pharmacist with each prescription and refill. Be sure to read this information carefully each time. ?Contact your care team regarding the use of this medication in children. While this medication may be prescribed for children as young as 63 years of age for selected conditions, precautions do apply. ?Overdosage: If you think you have taken too much of this medicine contact a poison control center or emergency room at once. ?NOTE: This medicine is only for you. Do not share this medicine with others. ?What if I miss a dose? ?Try not to miss a dose. Your care team will tell you when your next injection is due. Notify the office if you are unable to keep an appointment. ?What may interact with this medication? ?Medications for diabetes ?Medications that treat or prevent blood clots like warfarin ?Oxyphenbutazone ?Propranolol ?Steroid medications like prednisone or cortisone ?This list may not describe all possible interactions. Give your health care provider a list of all the medicines, herbs, non-prescription drugs, or dietary supplements you use. Also tell them if you smoke, drink alcohol, or use illegal drugs. Some items may interact with your medicine. ?What should I watch for while using this medication? ?Visit your care team for regular checks on your progress. They will need to check the level of testosterone in your blood. ?This  medication is only approved for use in men who have low levels of testosterone related to certain medical conditions. Heart attacks and strokes have been reported with the use of this medication. Notify your care team and seek emergency treatment if you develop breathing problems; changes in vision; confusion; chest pain or chest tightness; sudden arm pain; severe, sudden  headache; trouble speaking or understanding; sudden numbness or weakness of the face, arm or leg; loss of balance or coordination. Talk to your care team about the risks and benefits of this medication. ?This medication m

## 2021-11-03 NOTE — Progress Notes (Deleted)
2nd Mening B due on 12/01/2021 ? ?Established Patient Office Visit ? ?Subjective:  ?Patient ID: Erica Moody, adult    DOB: 12/30/2000  Age: 21 y.o. MRN: [id number] ? ?CC:  ?Chief Complaint  ?Patient presents with  ? Follow-up  ?  Start Testosterone  ? ? ?HPI ?Erica Moody presents for *** ? ?Past Medical History:  ?Diagnosis Date  ? Allergy   ? Asthma   ? as a child  ? Obesity   ? Prediabetes 04/10/2020  ? ? ?Past Surgical History:  ?Procedure Laterality Date  ? ADENOIDECTOMY    ? 2003  ? TONSILLECTOMY    ? 2004  ? ? ?Family History  ?Problem Relation Age of Onset  ? Hypertension Mother   ? Heart disease Father   ? Hypertension Father   ? Hyperlipidemia Father   ? Migraines Father   ? Arthritis Maternal Grandmother   ? Heart disease Maternal Grandfather 54  ?     heart anuresym  ? Hypertension Maternal Grandfather   ? Early death Maternal Grandfather   ? Cancer Paternal Grandfather   ? Alcohol abuse Neg Hx   ? Asthma Neg Hx   ? COPD Neg Hx   ? Birth defects Neg Hx   ? Depression Neg Hx   ? Diabetes Neg Hx   ? Drug abuse Neg Hx   ? Kidney disease Neg Hx   ? Mental illness Neg Hx   ? Stroke Neg Hx   ? Seizures Neg Hx   ? Thyroid disease Neg Hx   ? Vision loss Neg Hx   ? Varicose Veins Neg Hx   ? Miscarriages / Stillbirths Neg Hx   ? Mental retardation Neg Hx   ? Learning disabilities Neg Hx   ? ? ?Social History  ? ?Socioeconomic History  ? Marital status: Single  ?  Spouse name: Not on file  ? Number of children: Not on file  ? Years of education: Not on file  ? Highest education level: Not on file  ?Occupational History  ? Not on file  ?Tobacco Use  ? Smoking status: Never  ? Smokeless tobacco: Never  ?Vaping Use  ? Vaping Use: Never used  ?Substance and Sexual Activity  ? Alcohol use: Yes  ?  Alcohol/week: 1.0 standard drink  ?  Types: 1 Standard drinks or equivalent per week  ?  Comment: social  ? Drug use: Never  ? Sexual activity: Never  ?  Partners: Male  ?  Birth control/protection: Pill, None  ?Other Topics  Concern  ? Not on file  ?Social History Narrative  ? Lives with mother, step dad, step brother and cousin, [place]  ? Stays a lot with grandmothers during the summer while mother works overtime  ? ?Social Determinants of Health  ? ?Financial Resource Strain: Not on file  ?Food Insecurity: Not on file  ?Transportation Needs: Not on file  ?Physical Activity: Not on file  ?Stress: Not on file  ?Social Connections: Not on file  ?Intimate Partner Violence: Not on file  ? ? ?Outpatient Medications Prior to Visit  ?Medication Sig Dispense Refill  ? albuterol (VENTOLIN HFA) 108 (90 Base) MCG/ACT inhaler INHALE 2 PUFFS INTO THE LUNGS EVERY 4 HOURS AS NEEDED FOR WHEEZING OR SHORTNESS OF BREATH 18 g 0  ? cetirizine (ZYRTEC) 10 MG tablet Take 10 mg by mouth daily.    ? norethindrone-ethinyl estradiol (JUNEL FE 1/20) 1-20 MG-MCG tablet Take 1 tablet by mouth daily. 28 tablet  11  ? QUEtiapine (SEROQUEL) 100 MG tablet Take 100 mg by mouth at bedtime.    ? sertraline (ZOLOFT) 50 MG tablet Take by mouth.    ? tretinoin (RETIN-A) 0.025 % cream SMARTSIG:Sparingly Topical Every Evening    ? predniSONE (DELTASONE) 10 MG tablet Take q day 6,5,4,3,2,1 (Patient not taking: Reported on 09/23/2021) 21 tablet 0  ? QUEtiapine (SEROQUEL) 25 MG tablet Take 25 mg by mouth at bedtime.    ? ?No facility-administered medications prior to visit.  ? ? ?Allergies  ?Allergen Reactions  ? Pineapple   ? ? ?ROS ?Review of Systems ? ?  ?Objective:  ?  ?Physical Exam ? ?BP 138/89 (BP Location: Right Arm, Cuff Size: Large)   Pulse 97   Resp 20   Ht 5\' 8"  (1.727 m)   Wt 268 lb 9.6 oz (121.8 kg)   SpO2 97%   BMI 40.84 kg/m?  ?Wt Readings from Last 3 Encounters:  ?11/03/21 268 lb 9.6 oz (121.8 kg)  ?09/23/21 267 lb 14.4 oz (121.5 kg)  ?09/09/21 254 lb (115.2 kg)  ? ? ? ?Health Maintenance Due  ?Topic Date Due  ? HIV Screening  Never done  ? Hepatitis C Screening  Never done  ? COVID-19 Vaccine (3 - Booster for Pfizer series) 11/02/2020  ? PAP-Cervical Cytology  Screening  Never done  ? PAP SMEAR-Modifier  Never done  ? ? ?There are no preventive care reminders to display for this patient. ? ?Lab Results  ?Component Value Date  ? TSH 2.370 04/08/2020  ? ?Lab Results  ?Component Value Date  ? WBC 8.1 09/09/2021  ? HGB 13.8 09/09/2021  ? HCT 44.8 09/09/2021  ? MCV 81.3 09/09/2021  ? PLT 528 (H) 09/09/2021  ? ?Lab Results  ?Component Value Date  ? NA 135 09/09/2021  ? K 3.9 09/09/2021  ? CO2 21 (L) 09/09/2021  ? GLUCOSE 85 09/09/2021  ? BUN 11 09/09/2021  ? CREATININE 0.88 09/09/2021  ? BILITOT 0.2 (L) 09/09/2021  ? ALKPHOS 53 09/09/2021  ? AST 25 09/09/2021  ? ALT 61 (H) 09/09/2021  ? PROT 7.2 09/09/2021  ? ALBUMIN 3.3 (L) 09/09/2021  ? CALCIUM 8.9 09/09/2021  ? ANIONGAP 10 09/09/2021  ? ?Lab Results  ?Component Value Date  ? CHOL 109 02/20/2013  ? ?Lab Results  ?Component Value Date  ? HDL 50 02/20/2013  ? ?Lab Results  ?Component Value Date  ? LDLCALC 43 02/20/2013  ? ?Lab Results  ?Component Value Date  ? TRIG 82 02/20/2013  ? ?Lab Results  ?Component Value Date  ? CHOLHDL 2.2 02/20/2013  ? ?Lab Results  ?Component Value Date  ? HGBA1C 5.8 (H) 04/08/2020  ? ? ?  ?Assessment & Plan:  ? ?Problem List Items Addressed This Visit   ?None ?Visit Diagnoses   ? ? Immunization due    -  Primary  ? Relevant Orders  ? MENINGOCOCCAL MCV4O (Completed)  ? Meningococcal B, OMV (Completed)  ? Female-to-female transgender person      ? Relevant Medications  ? testosterone cypionate (DEPOTESTOSTERONE CYPIONATE) injection 100 mg (Start on 11/03/2021  4:45 PM)  ? ?  ? ? ?Meds ordered this encounter  ?Medications  ? testosterone cypionate (DEPOTESTOSTERONE CYPIONATE) injection 100 mg  ? ? ?Follow-up: Return in 2 weeks (on 11/17/2021) for Testosterone injection (Nurse Visit - teach for self injection) .  ? ? ?11/19/2021, CMA ?

## 2021-11-03 NOTE — Patient Instructions (Signed)
Testosterone Injection What is this medication? TESTOSTERONE (tes TOS ter one) is used to increase testosterone levels in your body. It belongs to a group of medications called androgen hormones. This medicine may be used for other purposes; ask your health care provider or pharmacist if you have questions. COMMON BRAND NAME(S): Andro-L.A., Aveed, Delatestryl, Depo-Testosterone, Virilon What should I tell my care team before I take this medication? They need to know if you have any of these conditions: Cancer Diabetes Heart disease Kidney disease Liver disease Lung disease Prostate disease An unusual or allergic reaction to testosterone, other medications, foods, dyes, or preservatives If a female partner is pregnant or trying to get pregnant Breast-feeding How should I use this medication? This medication is for injection into a muscle. It is usually given in a hospital or clinic. A special MedGuide will be given to you by the pharmacist with each prescription and refill. Be sure to read this information carefully each time. Contact your care team regarding the use of this medication in children. While this medication may be prescribed for children as young as 12 years of age for selected conditions, precautions do apply. Overdosage: If you think you have taken too much of this medicine contact a poison control center or emergency room at once. NOTE: This medicine is only for you. Do not share this medicine with others. What if I miss a dose? Try not to miss a dose. Your care team will tell you when your next injection is due. Notify the office if you are unable to keep an appointment. What may interact with this medication? Medications for diabetes Medications that treat or prevent blood clots like warfarin Oxyphenbutazone Propranolol Steroid medications like prednisone or cortisone This list may not describe all possible interactions. Give your health care provider a list of all the  medicines, herbs, non-prescription drugs, or dietary supplements you use. Also tell them if you smoke, drink alcohol, or use illegal drugs. Some items may interact with your medicine. What should I watch for while using this medication? Visit your care team for regular checks on your progress. They will need to check the level of testosterone in your blood. This medication is only approved for use in men who have low levels of testosterone related to certain medical conditions. Heart attacks and strokes have been reported with the use of this medication. Notify your care team and seek emergency treatment if you develop breathing problems; changes in vision; confusion; chest pain or chest tightness; sudden arm pain; severe, sudden headache; trouble speaking or understanding; sudden numbness or weakness of the face, arm or leg; loss of balance or coordination. Talk to your care team about the risks and benefits of this medication. This medication may affect blood sugar levels. If you have diabetes, check with your care team before you change your diet or the dose of your diabetic medication. Testosterone injections are not commonly used in women. Women should inform their care team if they wish to become pregnant or think they might be pregnant. There is a potential for serious side effects to an unborn child. Talk to your care team or pharmacist for more information. Talk with your care team about your birth control options while taking this medication. This medication is banned from use in athletes by most athletic organizations. What side effects may I notice from receiving this medication? Side effects that you should report to your care team as soon as possible: Allergic reactions--skin rash, itching, hives, swelling of the   face, lips, tongue, or throat Blood clot--pain, swelling, or warmth in the leg, shortness of breath, chest pain Heart attack--pain or tightness in the chest, shoulders, arms or jaw,  nausea, shortness of breath, cold or clammy skin, feeling faint or lightheaded Increase in blood pressure Liver injury--right upper belly pain, loss of appetite, nausea, light-colored stool, dark yellow or brown urine, yellowing of the skin or eyes, unusual weakness or fatigue Mood swings, irritability, or hostility Prolonged or painful erection Sleep apnea--loud snoring, gasping during sleep, daytime sleepiness Stroke--sudden numbness or weakness of the face, arm or leg, trouble speaking, confusion, trouble walking, loss of balance or coordination, dizziness, severe headache, change in vision Swelling of the ankles, hands, or feet Thoughts of suicide or self-harm, worsening mood, feelings of depression Side effects that usually do not require medical attention (report to your care team if they continue or are bothersome): Acne Change in sex drive or performance Pain, redness, or irritation at the application site Unexpected breast tissue growth This list may not describe all possible side effects. Call your doctor for medical advice about side effects. You may report side effects to FDA at 1-800-FDA-1088. Where should I keep my medication? Keep out of the reach of children. This medication can be abused. Keep your medication in a safe place to protect it from theft. Do not share this medication with anyone. Selling or giving away this medication is dangerous and against the law. Store at room temperature between 20 and 25 degrees C (68 and 77 degrees F). Do not freeze. Protect from light. Follow the directions for the product you are prescribed. Throw away any unused medication after the expiration date. NOTE: This sheet is a summary. It may not cover all possible information. If you have questions about this medicine, talk to your doctor, pharmacist, or health care provider.  2022 Elsevier/Gold Standard (2020-07-21 00:00:00)  

## 2021-11-04 ENCOUNTER — Encounter: Payer: Self-pay | Admitting: Medical-Surgical

## 2021-11-07 DIAGNOSIS — F4321 Adjustment disorder with depressed mood: Secondary | ICD-10-CM | POA: Diagnosis not present

## 2021-11-07 DIAGNOSIS — F411 Generalized anxiety disorder: Secondary | ICD-10-CM | POA: Diagnosis not present

## 2021-11-18 ENCOUNTER — Telehealth: Payer: Self-pay

## 2021-11-18 ENCOUNTER — Ambulatory Visit (INDEPENDENT_AMBULATORY_CARE_PROVIDER_SITE_OTHER): Payer: Federal, State, Local not specified - PPO | Admitting: Medical-Surgical

## 2021-11-18 VITALS — BP 140/65 | HR 67 | Wt 271.0 lb

## 2021-11-18 DIAGNOSIS — Z789 Other specified health status: Secondary | ICD-10-CM

## 2021-11-18 MED ORDER — "NEEDLE (DISP) 22G X 1-1/2"" MISC": 0 refills | Status: AC

## 2021-11-18 MED ORDER — "NEEDLE (DISP) 18G X 1"" MISC": 0 refills | Status: AC

## 2021-11-18 MED ORDER — TESTOSTERONE CYPIONATE 200 MG/ML IM SOLN: 100.0000 mg | INTRAMUSCULAR | 1 refills | Status: DC

## 2021-11-18 MED ORDER — SYRINGE (DISPOSABLE) 3 ML MISC: 1.0000 | 0 refills | Status: AC

## 2021-11-18 NOTE — Progress Notes (Signed)
Pt here for testosterone injection teaching. He was here 2 weeks ago denies any SOB,CP or mood swings. Pt was taught how to draw up injection  and how to prepare injection site. He Injected his L thigh and tolerated well.  ? ?Pt was comfortable with now performing his injections at home.  ? ?He was advised that he will need to come back to have labs done between his 3rd and 4th injection mid morning. He voiced understanding and agreed. Told that his medications and supplies would be sent to his pharmacy for pick up and should he have any problems to contact our office either by phone or my chart. He voiced understanding and agreed to this.  ?

## 2021-11-18 NOTE — Telephone Encounter (Signed)
Initiated Prior authorization [id number] Cypionate 200MG /ML intramuscular solution ?Via: Covermymeds ?Case/Key:[id number]  ?Status: approved as of 11/18/21 ?Reason:The authorization is valid from 10/19/2021 through 11/18/2023. ?Notified Pt via: Mychart ?

## 2021-11-18 NOTE — Progress Notes (Signed)
Testosterone cypionate, syringes, and needles for drawing/administering sent to the pharmacy on file.  Lab orders entered for blood draw between injections 3 and 4 as discussed.  Agree with documentation as above.  Plan in office follow-up for 3 months. ? ?___________________________________________ ?Thayer Ohm, DNP, APRN, FNP-BC ?Primary Care and Sports Medicine ?[facility] ? ?

## 2021-11-22 DIAGNOSIS — F411 Generalized anxiety disorder: Secondary | ICD-10-CM | POA: Diagnosis not present

## 2021-11-22 DIAGNOSIS — F4321 Adjustment disorder with depressed mood: Secondary | ICD-10-CM | POA: Diagnosis not present

## 2021-11-23 DIAGNOSIS — F4010 Social phobia, unspecified: Secondary | ICD-10-CM | POA: Diagnosis not present

## 2021-11-23 DIAGNOSIS — F323 Major depressive disorder, single episode, severe with psychotic features: Secondary | ICD-10-CM | POA: Diagnosis not present

## 2021-11-23 DIAGNOSIS — F411 Generalized anxiety disorder: Secondary | ICD-10-CM | POA: Diagnosis not present

## 2021-12-01 ENCOUNTER — Ambulatory Visit (INDEPENDENT_AMBULATORY_CARE_PROVIDER_SITE_OTHER): Payer: Federal, State, Local not specified - PPO | Admitting: Medical-Surgical

## 2021-12-01 VITALS — BP 133/71 | HR 59 | Ht 68.0 in | Wt 274.0 lb

## 2021-12-01 DIAGNOSIS — Z23 Encounter for immunization: Secondary | ICD-10-CM | POA: Diagnosis not present

## 2021-12-01 NOTE — Progress Notes (Signed)
Patient is here for a Meningococcal B vaccine. Verified no previous allergy to vaccine. Meningococcal B injection to left deltoid with no apparent complications. Patient advised to call with any problems.  ? ?

## 2021-12-01 NOTE — Progress Notes (Signed)
Agree with documentation as above.  ? ?___________________________________________ ?Pooja Camuso L. Evelisse Szalkowski, DNP, APRN, FNP-BC ?Primary Care and Sports Medicine ?[facility] ? ?

## 2021-12-09 DIAGNOSIS — Z789 Other specified health status: Secondary | ICD-10-CM | POA: Diagnosis not present

## 2021-12-14 LAB — TESTOSTERONE, TOTAL, LC/MS/MS: Testosterone, Total, LC-MS-MS: 588 ng/dL — ABNORMAL HIGH (ref 2–45)

## 2021-12-14 LAB — ESTRADIOL: Estradiol: 50 pg/mL

## 2021-12-14 LAB — LIPID PANEL
Cholesterol: 148 mg/dL (ref <200)
HDL: 60 mg/dL (ref >=50)
LDL Cholesterol (Calc): 73 mg/dL (calc)
Non-HDL Cholesterol (Calc): 88 mg/dL (calc) (ref <130)
Total CHOL/HDL Ratio: 2.5 (calc) (ref <5.0)
Triglycerides: 66 mg/dL (ref <150)

## 2021-12-14 LAB — CBC
HCT: 38.7 % (ref 35.0–45.0)
Hemoglobin: 12.5 g/dL (ref 11.7–15.5)
MCH: 25 pg — ABNORMAL LOW (ref 27.0–33.0)
MCHC: 32.3 g/dL (ref 32.0–36.0)
MCV: 77.2 fL — ABNORMAL LOW (ref 80.0–100.0)
MPV: 10 fL (ref 7.5–12.5)
Platelets: 363 10*3/uL (ref 140–400)
RBC: 5.01 10*6/uL (ref 3.80–5.10)
RDW: 13.5 % (ref 11.0–15.0)
WBC: 4.5 10*3/uL (ref 3.8–10.8)

## 2021-12-20 DIAGNOSIS — F4321 Adjustment disorder with depressed mood: Secondary | ICD-10-CM | POA: Diagnosis not present

## 2021-12-20 DIAGNOSIS — F411 Generalized anxiety disorder: Secondary | ICD-10-CM | POA: Diagnosis not present

## 2021-12-29 DIAGNOSIS — F411 Generalized anxiety disorder: Secondary | ICD-10-CM | POA: Diagnosis not present

## 2021-12-29 DIAGNOSIS — F323 Major depressive disorder, single episode, severe with psychotic features: Secondary | ICD-10-CM | POA: Diagnosis not present

## 2021-12-29 DIAGNOSIS — F4010 Social phobia, unspecified: Secondary | ICD-10-CM | POA: Diagnosis not present

## 2022-01-13 DIAGNOSIS — F4321 Adjustment disorder with depressed mood: Secondary | ICD-10-CM | POA: Diagnosis not present

## 2022-01-13 DIAGNOSIS — F411 Generalized anxiety disorder: Secondary | ICD-10-CM | POA: Diagnosis not present

## 2022-01-17 DIAGNOSIS — F323 Major depressive disorder, single episode, severe with psychotic features: Secondary | ICD-10-CM | POA: Diagnosis not present

## 2022-01-17 DIAGNOSIS — F4010 Social phobia, unspecified: Secondary | ICD-10-CM | POA: Diagnosis not present

## 2022-01-17 DIAGNOSIS — F411 Generalized anxiety disorder: Secondary | ICD-10-CM | POA: Diagnosis not present

## 2022-01-18 ENCOUNTER — Other Ambulatory Visit: Payer: Self-pay | Admitting: Medical-Surgical

## 2022-01-19 NOTE — Telephone Encounter (Signed)
Last written 11/18/2021 #83ml with 1 refill Last appt 11/03/2021

## 2022-01-27 DIAGNOSIS — F411 Generalized anxiety disorder: Secondary | ICD-10-CM | POA: Diagnosis not present

## 2022-01-27 DIAGNOSIS — F4321 Adjustment disorder with depressed mood: Secondary | ICD-10-CM | POA: Diagnosis not present

## 2022-02-02 ENCOUNTER — Encounter: Payer: Self-pay | Admitting: Medical-Surgical

## 2022-02-02 ENCOUNTER — Ambulatory Visit: Payer: Federal, State, Local not specified - PPO | Admitting: Medical-Surgical

## 2022-02-02 VITALS — BP 123/79 | HR 74 | Resp 20 | Ht 68.0 in | Wt 284.0 lb

## 2022-02-02 DIAGNOSIS — Z3009 Encounter for other general counseling and advice on contraception: Secondary | ICD-10-CM | POA: Diagnosis not present

## 2022-02-02 NOTE — Progress Notes (Signed)
   Established Patient Office Visit  Subjective   Patient ID: Erica Moody, transgender female   DOB: May 31, 2001 Age: 21 y.o. MRN: [id number]   Chief Complaint  Patient presents with   Contraception    HPI Very pleasant 21 year old transgender female presenting today to discuss birth control.  Previously to [facility] and was happy with the medication however is worried about the effects of ethinyl estradiol on masculinization efforts with testosterone.  Interested in long-term options and would like more information.  Not currently or previously sexually active but has heard stories of transgender men becoming pregnant while on testosterone therapy and would like to go ahead and get on birth control to prevent this.  No desire for children now or in the future.   Objective:    Vitals:   02/02/22 1545  BP: 123/79  Pulse: 74  Resp: 20  Height: 5\' 8"  (1.727 m)  Weight: 284 lb (128.8 kg)  SpO2: 98%  BMI (Calculated): 43.19    Physical Exam Vitals and nursing note reviewed.  Constitutional:      General: He is not in acute distress.    Appearance: Normal appearance. He is not ill-appearing.  HENT:     Head: Normocephalic and atraumatic.  Cardiovascular:     Rate and Rhythm: Normal rate and regular rhythm.  Pulmonary:     Effort: Pulmonary effort is normal. No respiratory distress.  Skin:    General: Skin is warm and dry.  Neurological:     Mental Status: He is alert and oriented to person, place, and time.  Psychiatric:        Mood and Affect: Mood normal.        Behavior: Behavior normal.        Thought Content: Thought content normal.        Judgment: Judgment normal.   No results found for this or any previous visit (from the past 24 hour(s)).     The ASCVD Risk score (Arnett DK, et al., 2019) failed to calculate for the following reasons:   The 2019 ASCVD risk score is only valid for ages 22 to 22   Assessment & Plan:   1. Birth control counseling Discussed various  options for birth control in transgender man.  Would like to avoid anything containing an estrogen so discussed progesterone only options.  Reviewed pills, injections, IUDs, and Nexplanon.  After discussion and questions answered, patient would like to proceed with Nexplanon placement.  Advised that he will need to schedule that as a separate appointment so that we have plenty of time for the procedure and any questions that may follow.  Patient verbalized understanding and is agreeable to the plan.  Return for Nexplanon placement at your earliest convenience.  ___________________________________________ 75, DNP, APRN, FNP-BC Primary Care and Sports Medicine [facility]

## 2022-02-08 DIAGNOSIS — F323 Major depressive disorder, single episode, severe with psychotic features: Secondary | ICD-10-CM | POA: Diagnosis not present

## 2022-02-08 DIAGNOSIS — F411 Generalized anxiety disorder: Secondary | ICD-10-CM | POA: Diagnosis not present

## 2022-02-08 DIAGNOSIS — F4010 Social phobia, unspecified: Secondary | ICD-10-CM | POA: Diagnosis not present

## 2022-02-11 ENCOUNTER — Other Ambulatory Visit: Payer: Self-pay | Admitting: Medical-Surgical

## 2022-02-13 NOTE — Telephone Encounter (Signed)
Patient will need an appointment for his 27-month follow-up to have labs rechecked.  Please contact him to facilitate getting this scheduled.

## 2022-02-14 NOTE — Telephone Encounter (Signed)
Patient has been scheduled for a f/u with PCP on next available, 03/15/22 & stated he is completely out of testosterone refills. AMUCK

## 2022-02-15 DIAGNOSIS — F4321 Adjustment disorder with depressed mood: Secondary | ICD-10-CM | POA: Diagnosis not present

## 2022-02-15 DIAGNOSIS — F411 Generalized anxiety disorder: Secondary | ICD-10-CM | POA: Diagnosis not present

## 2022-02-27 DIAGNOSIS — F4321 Adjustment disorder with depressed mood: Secondary | ICD-10-CM | POA: Diagnosis not present

## 2022-02-27 DIAGNOSIS — F411 Generalized anxiety disorder: Secondary | ICD-10-CM | POA: Diagnosis not present

## 2022-03-08 DIAGNOSIS — F411 Generalized anxiety disorder: Secondary | ICD-10-CM | POA: Diagnosis not present

## 2022-03-08 DIAGNOSIS — F4010 Social phobia, unspecified: Secondary | ICD-10-CM | POA: Diagnosis not present

## 2022-03-08 DIAGNOSIS — F323 Major depressive disorder, single episode, severe with psychotic features: Secondary | ICD-10-CM | POA: Diagnosis not present

## 2022-03-14 NOTE — Progress Notes (Unsigned)
   Established Patient Office Visit  Subjective   Patient ID: Erica Moody, transgender female   DOB: [birth year] Age: 21 y.o. MRN: [id number]   No chief complaint on file.   HPI Pleasant 21 year old transgender female presenting today for follow-up on hormone therapy.  ROS    Objective:    There were no vitals filed for this visit.  Physical Exam   No results found for this or any previous visit (from the past 24 hour(s)).   {Labs (Optional):23779}  The ASCVD Risk score (Arnett DK, et al., 2019) failed to calculate for the following reasons:   The 2019 ASCVD risk score is only valid for ages 74 to 68   Assessment & Plan:   No problem-specific Assessment & Plan notes found for this encounter.   No follow-ups on file.  ___________________________________________ Thayer Ohm, DNP, APRN, FNP-BC Primary Care and Sports Medicine [facility]

## 2022-03-15 ENCOUNTER — Ambulatory Visit: Payer: Federal, State, Local not specified - PPO | Admitting: Medical-Surgical

## 2022-03-15 ENCOUNTER — Encounter: Payer: Self-pay | Admitting: Medical-Surgical

## 2022-03-15 VITALS — BP 121/78 | HR 61 | Resp 20 | Ht 68.0 in | Wt 285.7 lb

## 2022-03-15 DIAGNOSIS — Z789 Other specified health status: Secondary | ICD-10-CM

## 2022-03-15 DIAGNOSIS — R7303 Prediabetes: Secondary | ICD-10-CM | POA: Diagnosis not present

## 2022-03-15 NOTE — Progress Notes (Signed)
NEXPLANON INSERTION PRE-OP DIAGNOSIS: desired long-term, reversible contraception  POST-OP DIAGNOSIS: Same  PROCEDURE: Nexplanon  placement Performing Provider: Christen Butter, FNP  Risks and benefits reviewed with the patient. Informed consent obtained, patient opts to proceed today.    PROCEDURE:  Site (check): left arm  Lot # [id number] Sterile Preparation: Chlorhexidine   Insertion site was selected 8 - 10 cm from medial epicondyle and marked along with guiding site using sterile marker  Procedure area was prepped and draped in a sterile fashion.  4 mL of 1% lidocaine without epinephrine used for subcutaneous anesthesia. Anesthesia confirmed.  Nexplanon  trocar was inserted subcutaneously and then Nexplanon  capsule delivered subcutaneously Trocar was removed from the insertion site. Nexplanon  capsule was palpated by provider and patient to assure satisfactory placement. Estimated blood loss <1 mL Dressings applied: Steri-Strip and small pressure bandage Followup: The patient tolerated the procedure well without complications.  Standard post-procedure care is explained and return precautions are given. ___________________________________________ Thayer Ohm, DNP, APRN, FNP-BC Primary Care and Sports Medicine [facility]

## 2022-03-16 ENCOUNTER — Other Ambulatory Visit: Payer: Self-pay | Admitting: Medical-Surgical

## 2022-03-17 ENCOUNTER — Encounter: Payer: Self-pay | Admitting: Medical-Surgical

## 2022-03-17 ENCOUNTER — Ambulatory Visit: Payer: Federal, State, Local not specified - PPO | Admitting: Medical-Surgical

## 2022-03-17 VITALS — BP 121/79 | HR 71 | Resp 20 | Ht 68.0 in | Wt 285.7 lb

## 2022-03-17 DIAGNOSIS — Z789 Other specified health status: Secondary | ICD-10-CM | POA: Diagnosis not present

## 2022-03-17 DIAGNOSIS — Z30017 Encounter for initial prescription of implantable subdermal contraceptive: Secondary | ICD-10-CM

## 2022-03-17 DIAGNOSIS — R7303 Prediabetes: Secondary | ICD-10-CM | POA: Diagnosis not present

## 2022-03-17 LAB — POCT URINE PREGNANCY: Preg Test, Ur: NEGATIVE

## 2022-03-19 LAB — CBC WITH DIFFERENTIAL/PLATELET
Absolute Monocytes: 494 cells/uL (ref 200–950)
Basophils Absolute: 18 cells/uL (ref 0–200)
Basophils Relative: 0.3 %
Eosinophils Absolute: 189 cells/uL (ref 15–500)
Eosinophils Relative: 3.1 %
HCT: 44.3 % (ref 35.0–45.0)
Hemoglobin: 13.8 g/dL (ref 11.7–15.5)
Lymphs Abs: 2159 cells/uL (ref 850–3900)
MCH: 23.3 pg — ABNORMAL LOW (ref 27.0–33.0)
MCHC: 31.2 g/dL — ABNORMAL LOW (ref 32.0–36.0)
MCV: 74.8 fL — ABNORMAL LOW (ref 80.0–100.0)
MPV: 9.8 fL (ref 7.5–12.5)
Monocytes Relative: 8.1 %
Neutro Abs: 3239 cells/uL (ref 1500–7800)
Neutrophils Relative %: 53.1 %
Platelets: 416 10*3/uL — ABNORMAL HIGH (ref 140–400)
RBC: 5.92 10*6/uL — ABNORMAL HIGH (ref 3.80–5.10)
RDW: 14.5 % (ref 11.0–15.0)
Total Lymphocyte: 35.4 %
WBC: 6.1 10*3/uL (ref 3.8–10.8)

## 2022-03-19 LAB — TESTOSTERONE, TOTAL, LC/MS/MS: Testosterone, Total, LC-MS-MS: 704 ng/dL — ABNORMAL HIGH (ref 2–45)

## 2022-03-19 LAB — ESTRADIOL: Estradiol: 87 pg/mL

## 2022-03-19 LAB — HEMOGLOBIN A1C
Hgb A1c MFr Bld: 5.5 % of total Hgb (ref <5.7)
Mean Plasma Glucose: 111 mg/dL
eAG (mmol/L): 6.2 mmol/L

## 2022-03-22 ENCOUNTER — Other Ambulatory Visit: Payer: Self-pay

## 2022-03-22 DIAGNOSIS — F411 Generalized anxiety disorder: Secondary | ICD-10-CM | POA: Diagnosis not present

## 2022-03-22 DIAGNOSIS — F4010 Social phobia, unspecified: Secondary | ICD-10-CM | POA: Diagnosis not present

## 2022-03-22 DIAGNOSIS — F323 Major depressive disorder, single episode, severe with psychotic features: Secondary | ICD-10-CM | POA: Diagnosis not present

## 2022-03-22 NOTE — Progress Notes (Unsigned)
Pt called need a rx fill on Testerone

## 2022-03-23 NOTE — Telephone Encounter (Signed)
Pt called. He will call back to let us know what mg he is doing.

## 2022-03-23 NOTE — Telephone Encounter (Signed)
Per result note on 03/19/2022: need to contact patient to verify testosterone dose that he is taking. There was some confusion and he wasn't sure if he was doing 100mg  or 200mg  with each injection. Need clarification in order to accurately prescribe.

## 2022-03-24 MED ORDER — TESTOSTERONE CYPIONATE 200 MG/ML IM SOLN: INTRAMUSCULAR | 2 refills | Status: DC

## 2022-03-24 NOTE — Telephone Encounter (Signed)
Per patient, he is doing 1 mg/every 2 weeks. Patient is due for his injection today but is out of refills. Requesting for provider to send in the refill to the pharmacy.

## 2022-03-24 NOTE — Telephone Encounter (Signed)
Task completed. Patient has been updated that refill has been sent to the pharmacy.

## 2022-03-27 DIAGNOSIS — F4321 Adjustment disorder with depressed mood: Secondary | ICD-10-CM | POA: Diagnosis not present

## 2022-03-27 DIAGNOSIS — F411 Generalized anxiety disorder: Secondary | ICD-10-CM | POA: Diagnosis not present

## 2022-04-05 DIAGNOSIS — F411 Generalized anxiety disorder: Secondary | ICD-10-CM | POA: Diagnosis not present

## 2022-04-05 DIAGNOSIS — F4010 Social phobia, unspecified: Secondary | ICD-10-CM | POA: Diagnosis not present

## 2022-04-05 DIAGNOSIS — F323 Major depressive disorder, single episode, severe with psychotic features: Secondary | ICD-10-CM | POA: Diagnosis not present

## 2022-04-12 ENCOUNTER — Ambulatory Visit: Payer: Federal, State, Local not specified - PPO | Admitting: Sports Medicine

## 2022-04-12 DIAGNOSIS — F4321 Adjustment disorder with depressed mood: Secondary | ICD-10-CM | POA: Diagnosis not present

## 2022-04-12 DIAGNOSIS — F411 Generalized anxiety disorder: Secondary | ICD-10-CM | POA: Diagnosis not present

## 2022-04-19 DIAGNOSIS — F411 Generalized anxiety disorder: Secondary | ICD-10-CM | POA: Diagnosis not present

## 2022-04-19 DIAGNOSIS — F323 Major depressive disorder, single episode, severe with psychotic features: Secondary | ICD-10-CM | POA: Diagnosis not present

## 2022-04-26 DIAGNOSIS — F4321 Adjustment disorder with depressed mood: Secondary | ICD-10-CM | POA: Diagnosis not present

## 2022-04-26 DIAGNOSIS — F411 Generalized anxiety disorder: Secondary | ICD-10-CM | POA: Diagnosis not present

## 2022-05-17 DIAGNOSIS — F323 Major depressive disorder, single episode, severe with psychotic features: Secondary | ICD-10-CM | POA: Diagnosis not present

## 2022-05-17 DIAGNOSIS — F411 Generalized anxiety disorder: Secondary | ICD-10-CM | POA: Diagnosis not present

## 2022-05-17 DIAGNOSIS — F4010 Social phobia, unspecified: Secondary | ICD-10-CM | POA: Diagnosis not present

## 2022-05-26 DIAGNOSIS — F4321 Adjustment disorder with depressed mood: Secondary | ICD-10-CM | POA: Diagnosis not present

## 2022-05-26 DIAGNOSIS — F411 Generalized anxiety disorder: Secondary | ICD-10-CM | POA: Diagnosis not present

## 2022-06-07 DIAGNOSIS — F411 Generalized anxiety disorder: Secondary | ICD-10-CM | POA: Diagnosis not present

## 2022-06-07 DIAGNOSIS — F323 Major depressive disorder, single episode, severe with psychotic features: Secondary | ICD-10-CM | POA: Diagnosis not present

## 2022-06-07 DIAGNOSIS — F4010 Social phobia, unspecified: Secondary | ICD-10-CM | POA: Diagnosis not present

## 2022-06-09 ENCOUNTER — Telehealth: Payer: Self-pay

## 2022-06-09 DIAGNOSIS — Z789 Other specified health status: Secondary | ICD-10-CM

## 2022-06-09 NOTE — Telephone Encounter (Signed)
Pt aware of recommendations

## 2022-06-09 NOTE — Telephone Encounter (Signed)
Patient needs labs for testosterone before his next refill. Can you place the order for labs or does he need to make an appointment with you for a 3 month follow up since he was last here 03/17/2022.

## 2022-06-09 NOTE — Telephone Encounter (Signed)
Lab orders entered. Once results are available, will refill meds if appropriate.   ___________________________________________ Clearnce Sorrel, DNP, APRN, FNP-BC Primary Care and [place]

## 2022-06-12 DIAGNOSIS — Z789 Other specified health status: Secondary | ICD-10-CM | POA: Diagnosis not present

## 2022-06-16 ENCOUNTER — Other Ambulatory Visit: Payer: Self-pay

## 2022-06-16 LAB — LIPID PANEL
Cholesterol: 126 mg/dL (ref <200)
HDL: 43 mg/dL — ABNORMAL LOW (ref >=50)
LDL Cholesterol (Calc): 60 mg/dL (calc)
Non-HDL Cholesterol (Calc): 83 mg/dL (calc) (ref <130)
Total CHOL/HDL Ratio: 2.9 (calc) (ref <5.0)
Triglycerides: 157 mg/dL — ABNORMAL HIGH (ref <150)

## 2022-06-16 LAB — CBC
HCT: 41.7 % (ref 35.0–45.0)
Hemoglobin: 13.7 g/dL (ref 11.7–15.5)
MCH: 23.5 pg — ABNORMAL LOW (ref 27.0–33.0)
MCHC: 32.9 g/dL (ref 32.0–36.0)
MCV: 71.4 fL — ABNORMAL LOW (ref 80.0–100.0)
MPV: 10.1 fL (ref 7.5–12.5)
Platelets: 378 10*3/uL (ref 140–400)
RBC: 5.84 10*6/uL — ABNORMAL HIGH (ref 3.80–5.10)
RDW: 14.8 % (ref 11.0–15.0)
WBC: 4.6 10*3/uL (ref 3.8–10.8)

## 2022-06-16 LAB — TESTOSTERONE, TOTAL, LC/MS/MS: Testosterone, Total, LC-MS-MS: 510 ng/dL — ABNORMAL HIGH (ref 2–45)

## 2022-06-16 LAB — ESTRADIOL: Estradiol: 62 pg/mL

## 2022-06-16 MED ORDER — TESTOSTERONE CYPIONATE 200 MG/ML IM SOLN: INTRAMUSCULAR | 2 refills | Status: DC

## 2022-06-26 DIAGNOSIS — F4321 Adjustment disorder with depressed mood: Secondary | ICD-10-CM | POA: Diagnosis not present

## 2022-06-26 DIAGNOSIS — F411 Generalized anxiety disorder: Secondary | ICD-10-CM | POA: Diagnosis not present

## 2022-07-05 DIAGNOSIS — F4010 Social phobia, unspecified: Secondary | ICD-10-CM | POA: Diagnosis not present

## 2022-07-05 DIAGNOSIS — F323 Major depressive disorder, single episode, severe with psychotic features: Secondary | ICD-10-CM | POA: Diagnosis not present

## 2022-07-05 DIAGNOSIS — F411 Generalized anxiety disorder: Secondary | ICD-10-CM | POA: Diagnosis not present

## 2022-07-25 DIAGNOSIS — F411 Generalized anxiety disorder: Secondary | ICD-10-CM | POA: Diagnosis not present

## 2022-07-25 DIAGNOSIS — F4321 Adjustment disorder with depressed mood: Secondary | ICD-10-CM | POA: Diagnosis not present

## 2022-08-09 DIAGNOSIS — F4010 Social phobia, unspecified: Secondary | ICD-10-CM | POA: Diagnosis not present

## 2022-08-09 DIAGNOSIS — F323 Major depressive disorder, single episode, severe with psychotic features: Secondary | ICD-10-CM | POA: Diagnosis not present

## 2022-08-09 DIAGNOSIS — F411 Generalized anxiety disorder: Secondary | ICD-10-CM | POA: Diagnosis not present

## 2022-09-06 DIAGNOSIS — F411 Generalized anxiety disorder: Secondary | ICD-10-CM | POA: Diagnosis not present

## 2022-09-06 DIAGNOSIS — F323 Major depressive disorder, single episode, severe with psychotic features: Secondary | ICD-10-CM | POA: Diagnosis not present

## 2022-09-12 ENCOUNTER — Other Ambulatory Visit: Payer: Self-pay | Admitting: Medical-Surgical

## 2022-09-12 DIAGNOSIS — Z789 Other specified health status: Secondary | ICD-10-CM

## 2022-09-13 DIAGNOSIS — F411 Generalized anxiety disorder: Secondary | ICD-10-CM | POA: Diagnosis not present

## 2022-09-13 DIAGNOSIS — F4321 Adjustment disorder with depressed mood: Secondary | ICD-10-CM | POA: Diagnosis not present

## 2022-09-16 LAB — TESTOSTERONE, TOTAL, LC/MS/MS: Testosterone, Total, LC-MS-MS: 556 ng/dL — ABNORMAL HIGH (ref 2–45)

## 2022-09-18 ENCOUNTER — Other Ambulatory Visit: Payer: Self-pay

## 2022-09-18 MED ORDER — TESTOSTERONE CYPIONATE 200 MG/ML IM SOLN: INTRAMUSCULAR | 2 refills | Status: AC

## 2022-09-18 NOTE — Telephone Encounter (Signed)
Called patient with testosterone results. Patient was informed to continue testosterone at current dosing - patient states he is in need of refills.

## 2022-10-04 DIAGNOSIS — F411 Generalized anxiety disorder: Secondary | ICD-10-CM | POA: Diagnosis not present

## 2022-10-04 DIAGNOSIS — F4010 Social phobia, unspecified: Secondary | ICD-10-CM | POA: Diagnosis not present

## 2022-10-04 DIAGNOSIS — F323 Major depressive disorder, single episode, severe with psychotic features: Secondary | ICD-10-CM | POA: Diagnosis not present

## 2022-10-11 DIAGNOSIS — F411 Generalized anxiety disorder: Secondary | ICD-10-CM | POA: Diagnosis not present

## 2022-10-11 DIAGNOSIS — F4321 Adjustment disorder with depressed mood: Secondary | ICD-10-CM | POA: Diagnosis not present

## 2022-10-25 DIAGNOSIS — F4321 Adjustment disorder with depressed mood: Secondary | ICD-10-CM | POA: Diagnosis not present

## 2022-10-25 DIAGNOSIS — F411 Generalized anxiety disorder: Secondary | ICD-10-CM | POA: Diagnosis not present

## 2022-11-01 DIAGNOSIS — F411 Generalized anxiety disorder: Secondary | ICD-10-CM | POA: Diagnosis not present

## 2022-11-01 DIAGNOSIS — F4010 Social phobia, unspecified: Secondary | ICD-10-CM | POA: Diagnosis not present

## 2022-11-01 DIAGNOSIS — F323 Major depressive disorder, single episode, severe with psychotic features: Secondary | ICD-10-CM | POA: Diagnosis not present

## 2022-11-08 DIAGNOSIS — F4321 Adjustment disorder with depressed mood: Secondary | ICD-10-CM | POA: Diagnosis not present

## 2022-11-08 DIAGNOSIS — F411 Generalized anxiety disorder: Secondary | ICD-10-CM | POA: Diagnosis not present

## 2022-11-22 DIAGNOSIS — F4321 Adjustment disorder with depressed mood: Secondary | ICD-10-CM | POA: Diagnosis not present

## 2022-11-22 DIAGNOSIS — F411 Generalized anxiety disorder: Secondary | ICD-10-CM | POA: Diagnosis not present

## 2022-11-29 DIAGNOSIS — F4010 Social phobia, unspecified: Secondary | ICD-10-CM | POA: Diagnosis not present

## 2022-11-29 DIAGNOSIS — F323 Major depressive disorder, single episode, severe with psychotic features: Secondary | ICD-10-CM | POA: Diagnosis not present

## 2022-11-29 DIAGNOSIS — F411 Generalized anxiety disorder: Secondary | ICD-10-CM | POA: Diagnosis not present

## 2022-12-01 DIAGNOSIS — F4010 Social phobia, unspecified: Secondary | ICD-10-CM | POA: Diagnosis not present

## 2022-12-01 DIAGNOSIS — F411 Generalized anxiety disorder: Secondary | ICD-10-CM | POA: Diagnosis not present

## 2022-12-01 DIAGNOSIS — F323 Major depressive disorder, single episode, severe with psychotic features: Secondary | ICD-10-CM | POA: Diagnosis not present

## 2022-12-08 DIAGNOSIS — F411 Generalized anxiety disorder: Secondary | ICD-10-CM | POA: Diagnosis not present

## 2022-12-08 DIAGNOSIS — F4010 Social phobia, unspecified: Secondary | ICD-10-CM | POA: Diagnosis not present

## 2022-12-08 DIAGNOSIS — F323 Major depressive disorder, single episode, severe with psychotic features: Secondary | ICD-10-CM | POA: Diagnosis not present

## 2022-12-14 DIAGNOSIS — F4321 Adjustment disorder with depressed mood: Secondary | ICD-10-CM | POA: Diagnosis not present

## 2022-12-14 DIAGNOSIS — F411 Generalized anxiety disorder: Secondary | ICD-10-CM | POA: Diagnosis not present

## 2023-01-02 DIAGNOSIS — F4321 Adjustment disorder with depressed mood: Secondary | ICD-10-CM | POA: Diagnosis not present

## 2023-01-02 DIAGNOSIS — F411 Generalized anxiety disorder: Secondary | ICD-10-CM | POA: Diagnosis not present

## 2023-01-10 DIAGNOSIS — F411 Generalized anxiety disorder: Secondary | ICD-10-CM | POA: Diagnosis not present

## 2023-01-10 DIAGNOSIS — F4010 Social phobia, unspecified: Secondary | ICD-10-CM | POA: Diagnosis not present

## 2023-01-10 DIAGNOSIS — F323 Major depressive disorder, single episode, severe with psychotic features: Secondary | ICD-10-CM | POA: Diagnosis not present

## 2023-02-07 DIAGNOSIS — F323 Major depressive disorder, single episode, severe with psychotic features: Secondary | ICD-10-CM | POA: Diagnosis not present

## 2023-02-07 DIAGNOSIS — F4010 Social phobia, unspecified: Secondary | ICD-10-CM | POA: Diagnosis not present

## 2023-02-07 DIAGNOSIS — F411 Generalized anxiety disorder: Secondary | ICD-10-CM | POA: Diagnosis not present

## 2023-02-19 DIAGNOSIS — F649 Gender identity disorder, unspecified: Secondary | ICD-10-CM | POA: Diagnosis not present

## 2023-02-20 DIAGNOSIS — Z113 Encounter for screening for infections with a predominantly sexual mode of transmission: Secondary | ICD-10-CM | POA: Diagnosis not present

## 2023-02-20 DIAGNOSIS — Z01419 Encounter for gynecological examination (general) (routine) without abnormal findings: Secondary | ICD-10-CM | POA: Diagnosis not present

## 2023-02-20 DIAGNOSIS — Z124 Encounter for screening for malignant neoplasm of cervix: Secondary | ICD-10-CM | POA: Diagnosis not present

## 2023-02-26 DIAGNOSIS — F4321 Adjustment disorder with depressed mood: Secondary | ICD-10-CM | POA: Diagnosis not present

## 2023-02-26 DIAGNOSIS — F411 Generalized anxiety disorder: Secondary | ICD-10-CM | POA: Diagnosis not present

## 2023-03-14 DIAGNOSIS — F411 Generalized anxiety disorder: Secondary | ICD-10-CM | POA: Diagnosis not present

## 2023-03-14 DIAGNOSIS — F4321 Adjustment disorder with depressed mood: Secondary | ICD-10-CM | POA: Diagnosis not present

## 2023-03-21 DIAGNOSIS — F411 Generalized anxiety disorder: Secondary | ICD-10-CM | POA: Diagnosis not present

## 2023-03-21 DIAGNOSIS — F331 Major depressive disorder, recurrent, moderate: Secondary | ICD-10-CM | POA: Diagnosis not present

## 2023-03-21 DIAGNOSIS — F641 Gender identity disorder in adolescence and adulthood: Secondary | ICD-10-CM | POA: Diagnosis not present

## 2023-04-04 DIAGNOSIS — F4010 Social phobia, unspecified: Secondary | ICD-10-CM | POA: Diagnosis not present

## 2023-04-04 DIAGNOSIS — F323 Major depressive disorder, single episode, severe with psychotic features: Secondary | ICD-10-CM | POA: Diagnosis not present

## 2023-04-04 DIAGNOSIS — F411 Generalized anxiety disorder: Secondary | ICD-10-CM | POA: Diagnosis not present

## 2023-04-18 DIAGNOSIS — F641 Gender identity disorder in adolescence and adulthood: Secondary | ICD-10-CM | POA: Diagnosis not present

## 2023-04-18 DIAGNOSIS — F411 Generalized anxiety disorder: Secondary | ICD-10-CM | POA: Diagnosis not present

## 2023-04-18 DIAGNOSIS — F331 Major depressive disorder, recurrent, moderate: Secondary | ICD-10-CM | POA: Diagnosis not present

## 2023-05-05 IMAGING — CR DG CHEST 2V
2 series · 2 of 2 positions shown · non-contrast
Comparison: Comparison is made with April 19, 2008.

CLINICAL DATA: A 20 year old female presents for evaluation of
cough congestion shortness of breath.

EXAM:
CHEST - 2 VIEW

[w chest pa]
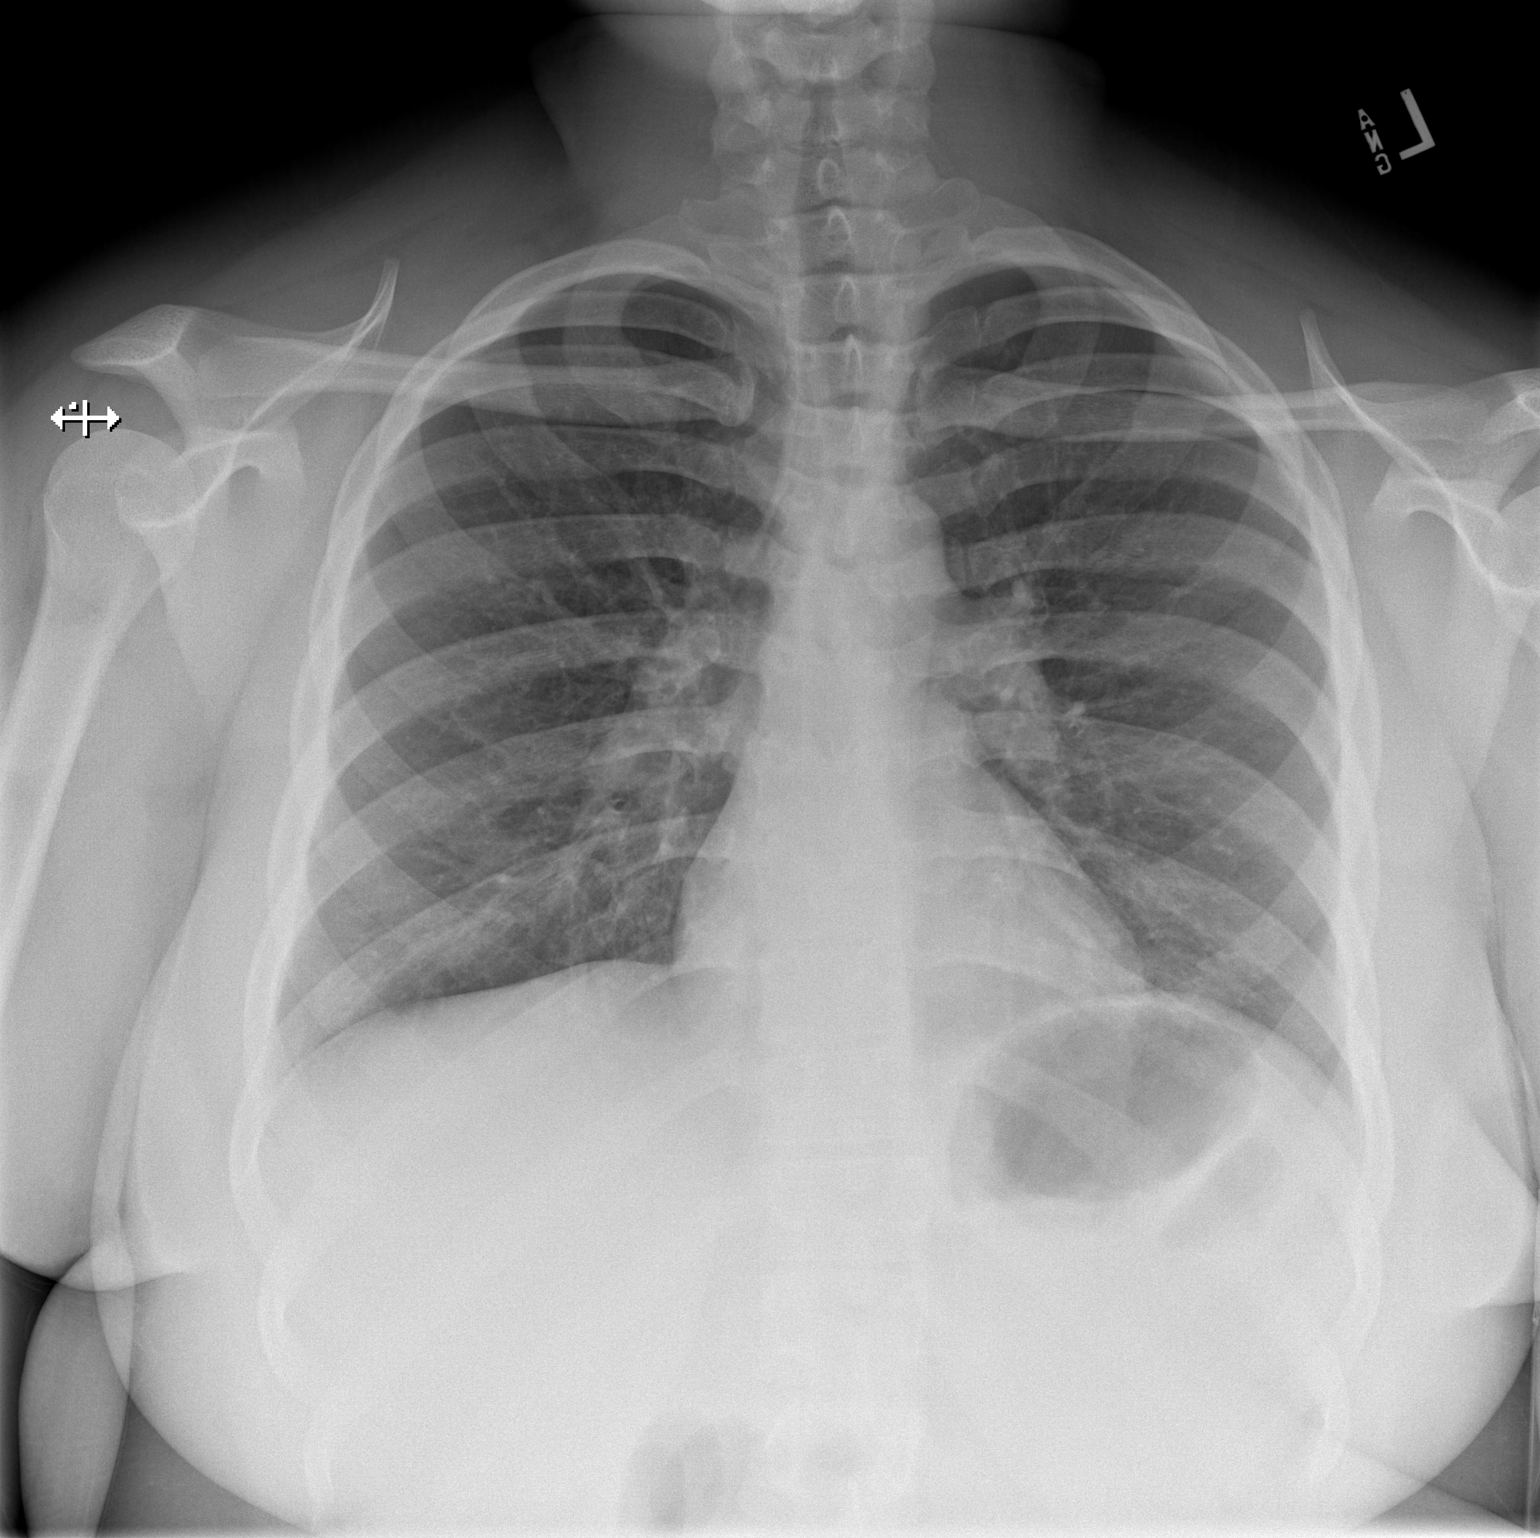

[w chest lat]
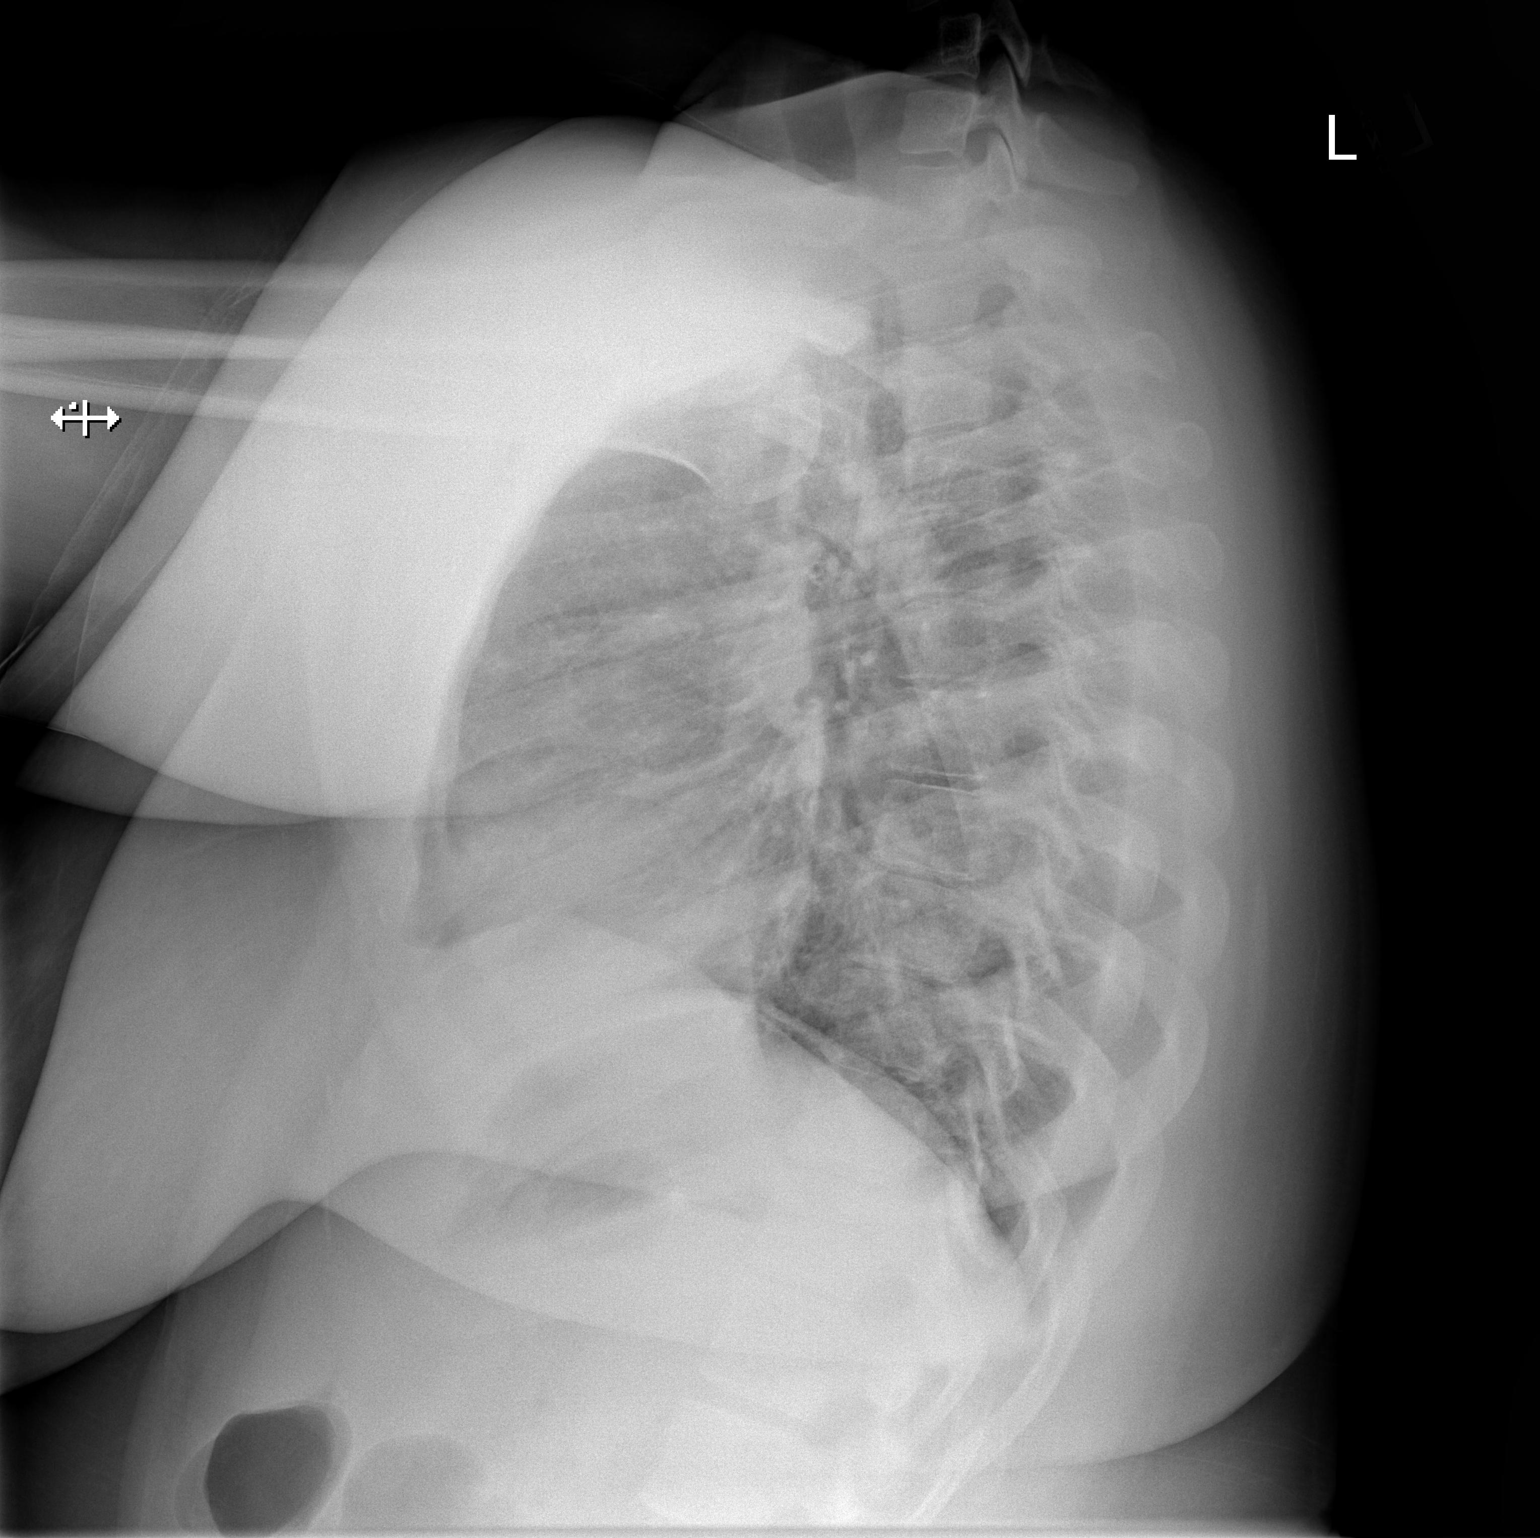

[2 of 2 positions shown; findings below may reference images not displayed]

FINDINGS: The heart size and mediastinal contours are within normal limits.
Both lungs are clear. The visualized skeletal structures are
unremarkable.
IMPRESSION: No active cardiopulmonary disease.

## 2023-05-12 IMAGING — CT CT ANGIO CHEST
3 of 7 series · 18 of 36 positions shown · IV contrast (agent unspecified)
Comparison: None.

CLINICAL DATA: Shortness of breath

EXAM:
CT ANGIOGRAPHY CHEST WITH CONTRAST
TECHNIQUE: Multidetector CT imaging of the chest was performed using the
standard protocol during bolus administration of intravenous
contrast. Multiplanar CT image reconstructions and MIPs were
obtained to evaluate the vascular anatomy.

[Series 6: thins · axial · 0.76mm/px · z∈[+1452,+1712]mm · 13 of 304 slices shown]
[im 22/304  lung]
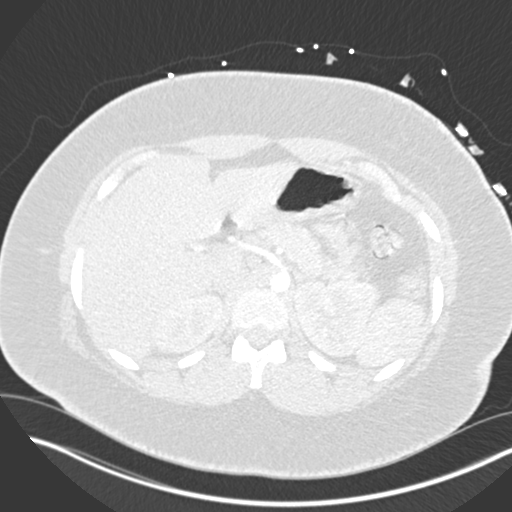
[im 44/304  mediastinal]
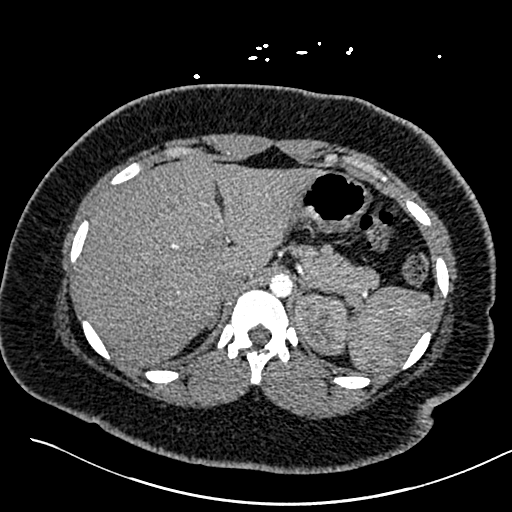
[im 65/304  lung]
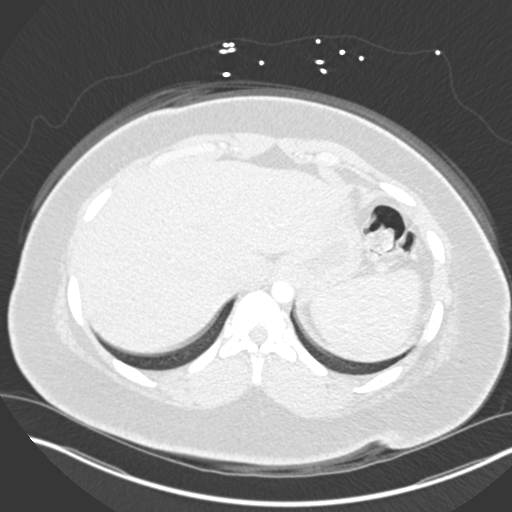
[im 87/304  mediastinal]
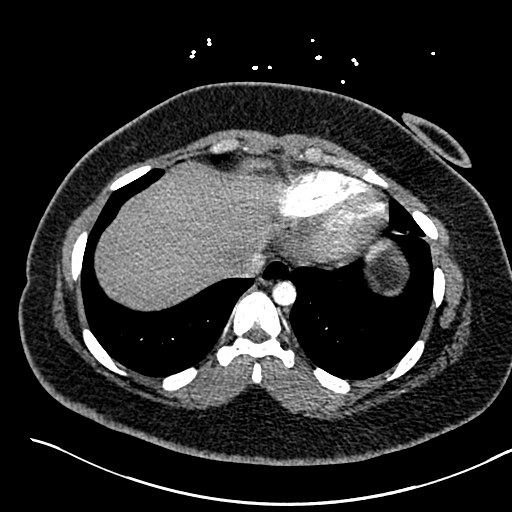
[im 109/304  lung]
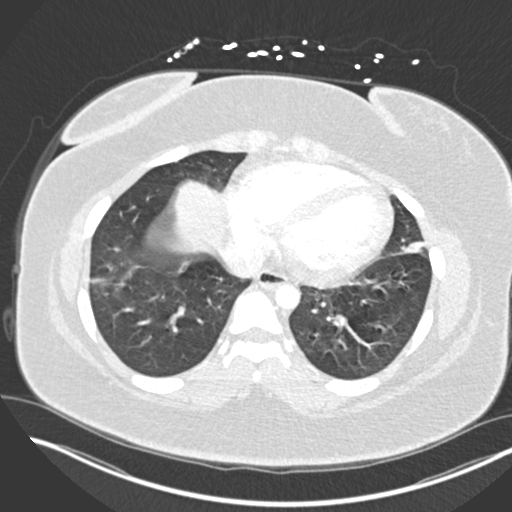
[im 130/304  mediastinal]
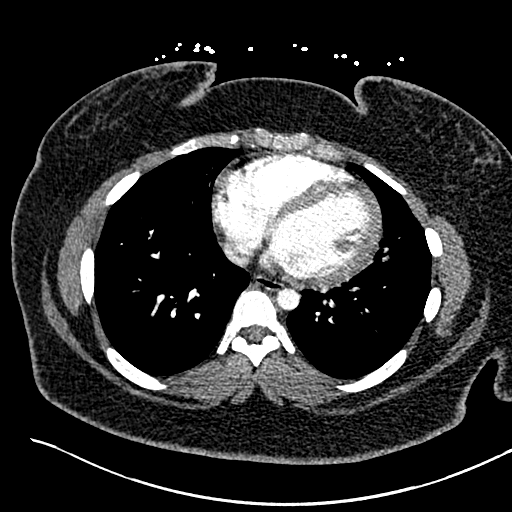
[im 152/304  lung]
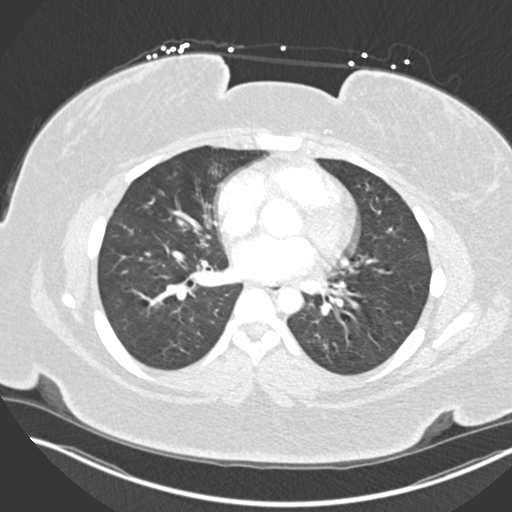
[im 174/304  mediastinal]
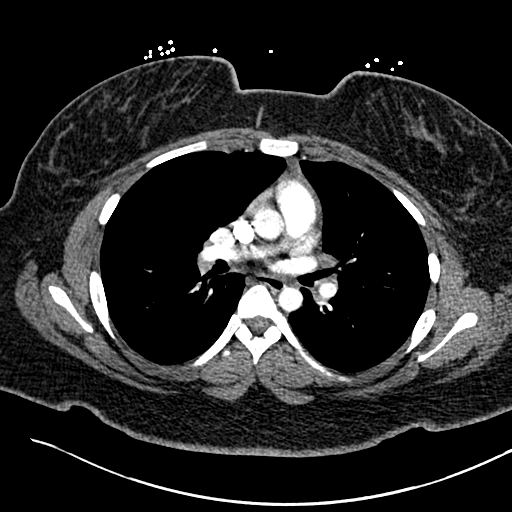
[im 195/304  lung]
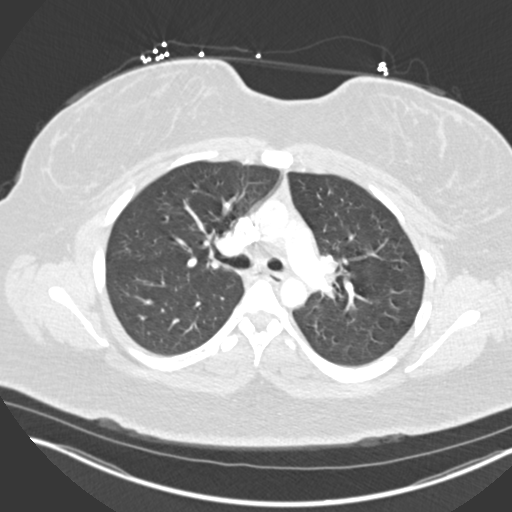
[im 217/304  mediastinal]
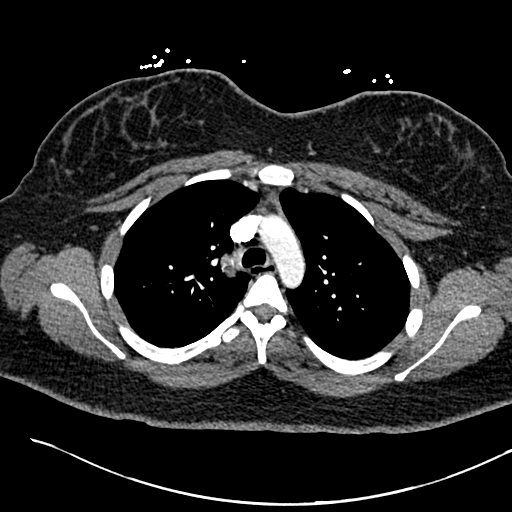
[im 239/304  lung]
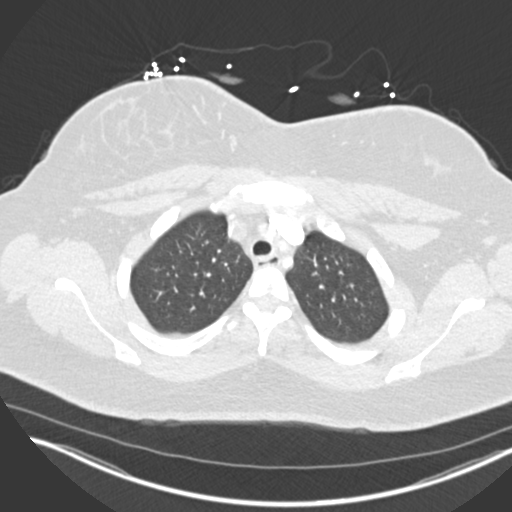
[im 260/304  mediastinal]
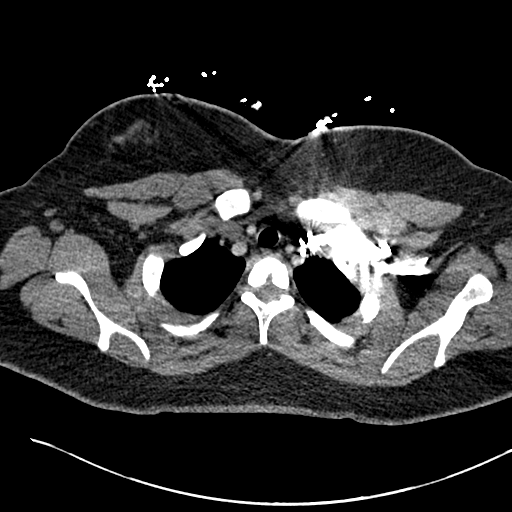
[im 282/304  lung]
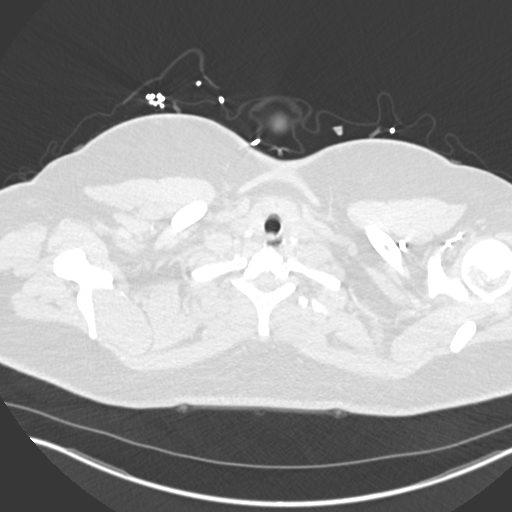

[Series 7: coronal mpr · coronal · 0.60mm/px · 1 of 130 slices shown]
[im 65/130  mediastinal]
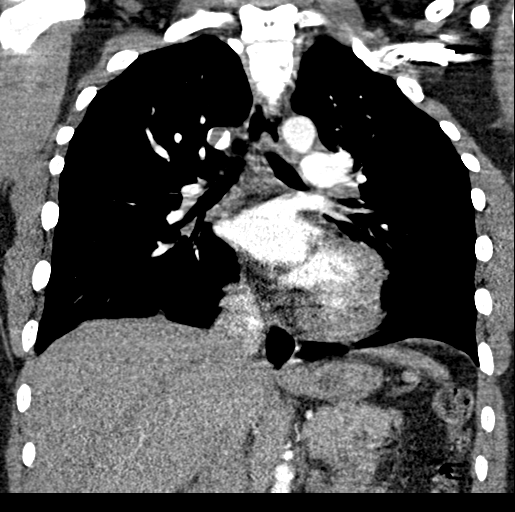

[Series 10: lung · axial · 0.64mm/px · z∈[+1526,+1670]mm · 4 of 122 slices shown]
[im 25/122  mediastinal]
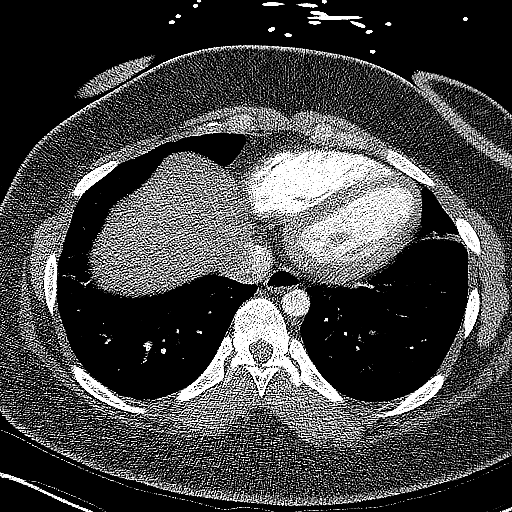
[im 49/122  mediastinal]
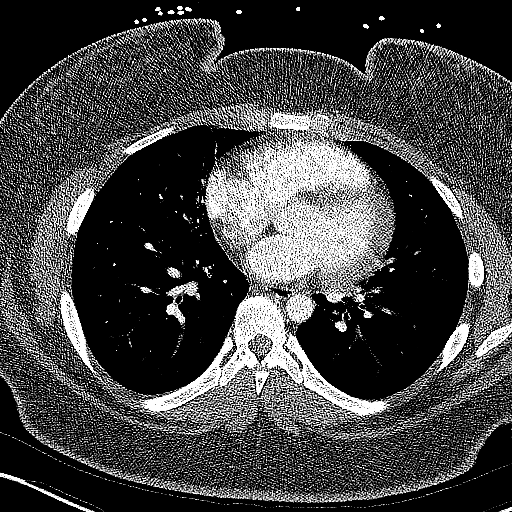
[im 73/122  mediastinal]
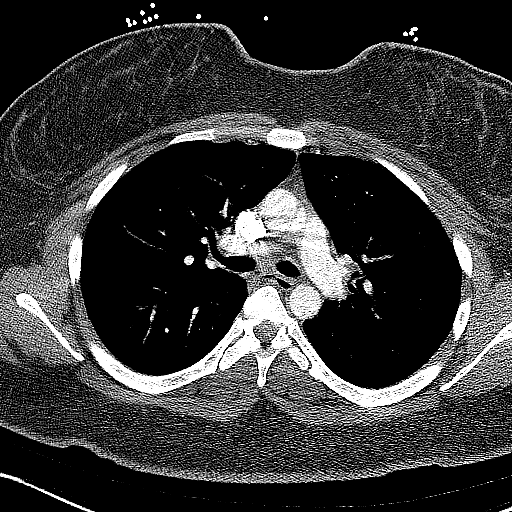
[im 97/122  mediastinal]
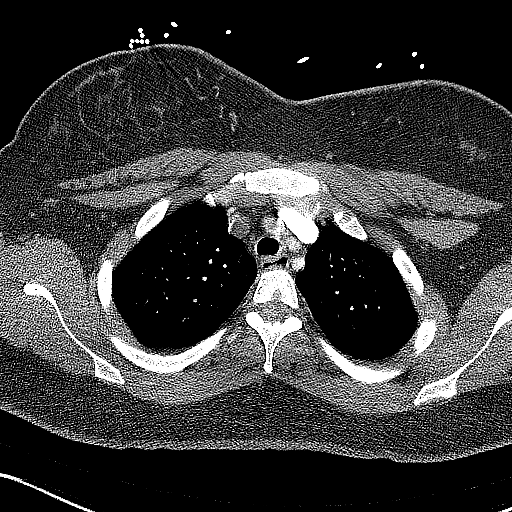

[18 of 36 positions shown; findings below may reference images not displayed]

RADIATION DOSE REDUCTION: This exam was performed according to the
departmental dose-optimization program which includes automated
exposure control, adjustment of the mA and/or kV according to
patient size and/or use of iterative reconstruction technique.

CONTRAST:  100mL OMNIPAQUE IOHEXOL 350 MG/ML SOLN
FINDINGS: Cardiovascular: No definite pulmonary embolism identified. Limited
evaluation due to breathing motion. Main pulmonary artery is normal
caliber. Heart size is normal. No pericardial effusion identified.
Thoracic aorta is normal in course and caliber.

Mediastinum/Nodes: A few mildly enlarged lymph nodes identified in
the bilateral hila measuring up to 13 mm on the right. No
mediastinal or axillary lymphadenopathy identified.

Lungs/Pleura: Small bands of subsegmental atelectasis identified in
the lingula and right lower lobe at the lung base. Mild opacities in
the right middle lobe which may represent atelectasis or possibly
early infiltrate. No pleural effusion or pneumothorax.

Upper Abdomen: No acute abnormality.

Musculoskeletal: No chest wall abnormality. No acute or significant
osseous findings.

Review of the MIP images confirms the above findings.
IMPRESSION: 1. No pulmonary embolism identified. Somewhat limited evaluation due
to breathing motion.
2. Mild opacities in the right middle lobe which may represent
subsegmental atelectasis or possibly early infiltrate. Bands of
subsegmental atelectasis in the lingula and right lower lobe.
3. A few mildly enlarged hilar lymph nodes.

## 2023-05-13 ENCOUNTER — Encounter (HOSPITAL_COMMUNITY): Payer: Self-pay | Admitting: *Deleted

## 2023-05-13 ENCOUNTER — Other Ambulatory Visit: Payer: Self-pay

## 2023-05-13 ENCOUNTER — Emergency Department (HOSPITAL_COMMUNITY)
Admission: EM | Admit: 2023-05-13 | Discharge: 2023-05-13 | Disposition: A | Discharge status: Home / Self Care | Payer: Worker's Compensation | Attending: Emergency Medicine | Admitting: Emergency Medicine

## 2023-05-13 DIAGNOSIS — S61412A Laceration without foreign body of left hand, initial encounter: Secondary | ICD-10-CM | POA: Insufficient documentation

## 2023-05-13 DIAGNOSIS — Y92000 Kitchen of unspecified non-institutional (private) residence as  the place of occurrence of the external cause: Secondary | ICD-10-CM | POA: Insufficient documentation

## 2023-05-13 DIAGNOSIS — W269XXA Contact with unspecified sharp object(s), initial encounter: Secondary | ICD-10-CM | POA: Diagnosis not present

## 2023-05-13 MED ORDER — LIDOCAINE HCL (PF) 1 % IJ SOLN
10.0000 mL | Freq: Once | INTRAMUSCULAR | Status: AC
  Administered 2023-05-13: 10 mL
  Filled 2023-05-13: qty 10

## 2023-05-13 NOTE — ED Provider Notes (Signed)
[place] EMERGENCY DEPARTMENT AT [facility] Provider Note   CSN: [id number] Arrival date & time: 05/13/23  1658     History  Chief Complaint  Patient presents with   Laceration   HPI Erica Moody is a 22 y.o. adult presenting for laceration.  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  Endorses normal range of motion of the hand and thumb.  Denies any loss of sensation.  States there was some bleeding at first but it was controlled with direct pressure and bandage at work.  States last tetanus shot was a year or 2 ago.   Laceration      Home Medications Prior to Admission medications   Medication Sig Start Date End Date Taking? Authorizing Provider  albuterol (VENTOLIN HFA) 108 (90 Base) MCG/ACT inhaler INHALE 2 PUFFS INTO THE LUNGS EVERY 4 HOURS AS NEEDED FOR WHEEZING OR SHORTNESS OF BREATH 10/18/21   Christen Butter, NP  cetirizine (ZYRTEC) 10 MG tablet Take 10 mg by mouth daily.    [provider]  Cholecalciferol (VITAMIN D3) 1.25 MG (50000 UT) CAPS Take 1 capsule by mouth once a week. 11/23/21   [provider]  hydrOXYzine (VISTARIL) 50 MG capsule Take 50 mg by mouth at bedtime. 01/21/22   [provider]  NEEDLE, DISP, 18 G 18G X 1" MISC As directed to DRAW Testosterone from vial 11/18/21   Christen Butter, NP  NEEDLE, DISP, 22 G 22G X 1-1/2" MISC As directed to ADMINISTER Testosterone IM injection 11/18/21   Christen Butter, NP  QUEtiapine Fumarate 150 MG TABS Take 1 tablet by mouth at bedtime. 01/17/22   [provider]  sertraline (ZOLOFT) 100 MG tablet Take 100 mg by mouth daily. 01/21/22   [provider]  Syringe, Disposable, 3 ML MISC 1 Syringe by Does not apply route every 14 (fourteen) days. 11/18/21   Christen Butter, NP  testosterone cypionate (DEPOTESTOSTERONE CYPIONATE) 200 MG/ML injection ADMINISTER 1 ML(200 MG) IN THE MUSCLE EVERY 14 DAYS 09/18/22   Christen Butter, NP   tretinoin (RETIN-A) 0.025 % cream SMARTSIG:Sparingly Topical Every Evening 05/11/20   [provider]      Allergies    Pineapple    Review of Systems   See HPI for pertinent positive  Physical Exam Updated Vital Signs BP 119/80   Pulse 67   Temp 98.6 F (37 C) (Oral)   Resp 12   Ht 5\' 8"  (1.727 m)   Wt 129.6 kg   SpO2 100%   BMI 43.44 kg/m  Physical Exam Constitutional:      Appearance: Normal appearance.  HENT:     Head: Normocephalic.     Nose: Nose normal.  Eyes:     Conjunctiva/sclera: Conjunctivae normal.  Pulmonary:     Effort: Pulmonary effort is normal.  Musculoskeletal:     Comments: Approximate 2.75 cm laceration noted at the palmar aspect of the base of the left thumb.  Not actively bleeding.  Range of motion of thumb appears normal.  Sensation intact.  Cap refill is brisk distally.  Radial pulses 2+ bilaterally.  Neurological:     Mental Status: He is alert.  Psychiatric:        Mood and Affect: Mood normal.     ED Results / Procedures / Treatments   Labs (all labs ordered are listed, but only abnormal results are displayed) Labs Reviewed - No data to display  EKG None  Radiology No results found.  Procedures .Marland KitchenLaceration Repair  Date/Time: 05/13/2023 6:52 PM  Performed by: Gareth Eagle, PA-C Authorized by: Gareth Eagle, PA-C   Consent:    Consent obtained:  Verbal   Consent given by:  Patient   Risks, benefits, and alternatives were discussed: yes     Risks discussed:  Infection, pain and need for additional repair   Alternatives discussed:  No treatment Universal protocol:    Procedure explained and questions answered to patient or proxy's satisfaction: yes     Relevant documents present and verified: yes     Patient identity confirmed:  Verbally with patient and arm band Anesthesia:    Anesthesia method:  Local infiltration   Local anesthetic:  Lidocaine 1% w/o epi Laceration details:    Location:  Hand   Hand  location:  L palm   Length (cm):  2.8   Depth (mm):  5 Pre-procedure details:    Preparation:  Patient was prepped and draped in usual sterile fashion Exploration:    Hemostasis achieved with:  Direct pressure   Imaging outcome: foreign body not noted     Wound exploration: wound explored through full range of motion     Wound extent: areolar tissue violated   Treatment:    Area cleansed with:  Povidone-iodine and saline   Amount of cleaning:  Extensive   Irrigation solution:  Sterile saline   Irrigation volume:  500 ml   Irrigation method:  Pressure wash   Debridement:  None   Undermining:  None Skin repair:    Repair method:  Sutures   Suture size:  4-0   Suture material:  Prolene   Suture technique:  Simple interrupted   Number of sutures:  5 Approximation:    Approximation:  Close Repair type:    Repair type:  Intermediate Post-procedure details:    Dressing:  Non-adherent dressing   Procedure completion:  Tolerated well, no immediate complications     Medications Ordered in ED Medications  lidocaine (PF) (XYLOCAINE) 1 % injection 10 mL (10 mLs Infiltration Given 05/13/23 1812)    ED Course/ Medical Decision Making/ A&P                                 Medical Decision Making Risk Prescription drug management.   22 year old female presenting for laceration.  Exam notable for laceration at the base of the left thumb on the palmar aspect.  Exam findings did not suggest any tendon involvement.  Laceration was well-approximated.  Laceration repair went very well.  Did not provide tetanus shot as she endorsed tetanus shot within the last 2 years.  Advised appropriate wound care at home.  Discussed follow-up with PCP or urgent care for reevaluation of the wound and suture removal in 6 to 8 days.  Advised to return to the ED if you are concerned with associated infection.  Vital stable.  Discharged good condition.         Final Clinical Impression(s) / ED  Diagnoses Final diagnoses:  Laceration of left palm, initial encounter    Rx / DC Orders ED Discharge Orders     None         Gareth Eagle, PA-C 05/13/23 1902    Terald Sleeper, MD 05/13/23 [phone]

## 2023-05-13 NOTE — ED Triage Notes (Signed)
The pt has a laceration to her lt hand fat pad of her lt thumb no active bleeding bandage in place  it occurred at 1500  lmp bc

## 2023-05-13 NOTE — Discharge Instructions (Signed)
Sutured repair Keep the laceration site dry for the next 24 hours and leave the dressing in place. After 24 hours you may remove the dressing and gently clean the laceration site with antibacterial soap and warm water. Do not scrub the area. Do not soak the area and water for long periods of time. Don't use hydrogen peroxide, iodine-based solutions, or alcohol, which can slow healing, and will probably be painful! Apply topical bacitracin 1-2 times per day for the next 3-5 days. Return to the emergency department in 5-7 days for removal of the sutures.  You should return sooner for any signs of infection which would include increased redness around the wound, increased swelling, new drainage of yellow pus.    Adhesive tape repair Tape strips have the advantage of being less painful to apply and lower risk of infection, but do require more caution on your part, as they are more fragile than other skin closure techniques.  It's important to: -Keep the tape clean and dry -Avoid picking at the tape or rubbing the area -Avoid soaking in water (showering is okay-bathing is not) -The tape strips will fall off on their own in about 5-7 days (if they don't, you can gently remove them or soak the wound in water at this time to loosen them).  At this time, scar tissue will be forming under the surface of the wound and your body will do the rest of the work of healing.

## 2023-05-17 DIAGNOSIS — F411 Generalized anxiety disorder: Secondary | ICD-10-CM | POA: Diagnosis not present

## 2023-05-17 DIAGNOSIS — F331 Major depressive disorder, recurrent, moderate: Secondary | ICD-10-CM | POA: Diagnosis not present

## 2023-05-17 DIAGNOSIS — F323 Major depressive disorder, single episode, severe with psychotic features: Secondary | ICD-10-CM | POA: Diagnosis not present

## 2023-05-17 DIAGNOSIS — F4010 Social phobia, unspecified: Secondary | ICD-10-CM | POA: Diagnosis not present

## 2023-05-17 DIAGNOSIS — F641 Gender identity disorder in adolescence and adulthood: Secondary | ICD-10-CM | POA: Diagnosis not present

## 2023-05-21 NOTE — Progress Notes (Signed)
 Kit is here for suture removal; He cut his hand on a sharp can lid; had sutures on 10/6.  O: Well healed lac left thumb base; 2mm of distal wound still slightly open. No sign of infection. 1. Visit for suture removal        Sutures removed.  Wound care discussed.  Recommend filing down callus that has formed on suture line to promote healing.

## 2023-05-22 DIAGNOSIS — F649 Gender identity disorder, unspecified: Secondary | ICD-10-CM | POA: Diagnosis not present

## 2023-05-30 DIAGNOSIS — F411 Generalized anxiety disorder: Secondary | ICD-10-CM | POA: Diagnosis not present

## 2023-05-30 DIAGNOSIS — F641 Gender identity disorder in adolescence and adulthood: Secondary | ICD-10-CM | POA: Diagnosis not present

## 2023-05-30 DIAGNOSIS — F331 Major depressive disorder, recurrent, moderate: Secondary | ICD-10-CM | POA: Diagnosis not present

## 2023-06-13 DIAGNOSIS — F331 Major depressive disorder, recurrent, moderate: Secondary | ICD-10-CM | POA: Diagnosis not present

## 2023-06-13 DIAGNOSIS — F411 Generalized anxiety disorder: Secondary | ICD-10-CM | POA: Diagnosis not present

## 2023-06-13 DIAGNOSIS — F641 Gender identity disorder in adolescence and adulthood: Secondary | ICD-10-CM | POA: Diagnosis not present

## 2023-07-11 DIAGNOSIS — F331 Major depressive disorder, recurrent, moderate: Secondary | ICD-10-CM | POA: Diagnosis not present

## 2023-07-11 DIAGNOSIS — F641 Gender identity disorder in adolescence and adulthood: Secondary | ICD-10-CM | POA: Diagnosis not present

## 2023-07-11 DIAGNOSIS — F411 Generalized anxiety disorder: Secondary | ICD-10-CM | POA: Diagnosis not present

## 2023-07-18 DIAGNOSIS — F411 Generalized anxiety disorder: Secondary | ICD-10-CM | POA: Diagnosis not present

## 2023-07-18 DIAGNOSIS — F4010 Social phobia, unspecified: Secondary | ICD-10-CM | POA: Diagnosis not present

## 2023-07-18 DIAGNOSIS — F323 Major depressive disorder, single episode, severe with psychotic features: Secondary | ICD-10-CM | POA: Diagnosis not present

## 2023-07-25 DIAGNOSIS — F331 Major depressive disorder, recurrent, moderate: Secondary | ICD-10-CM | POA: Diagnosis not present

## 2023-07-25 DIAGNOSIS — F641 Gender identity disorder in adolescence and adulthood: Secondary | ICD-10-CM | POA: Diagnosis not present

## 2023-07-25 DIAGNOSIS — F411 Generalized anxiety disorder: Secondary | ICD-10-CM | POA: Diagnosis not present

## 2024-04-08 ENCOUNTER — Encounter: Payer: Self-pay | Admitting: Sports Medicine

## 2024-07-29 NOTE — Progress Notes (Signed)
 [address] [facility] [address] - AMBULATORY [facility]  - URGENT CARE [place] [address] [facility] [place] [id number]   Date of Service: 07/29/2024 Patient DOB: 08-23-2000    History of Present Illness   Patient ID: Gera Monick Rena is a 23 y.o. adult.  History of Present Illness This is a patient presenting with right ear pain, difficulty swallowing, and right foot pain.  The patient has been experiencing intermittent fever since the end of 04/2024. She reports ear pain and difficulty swallowing, which started last week. The discomfort is significant enough to deter him from drinking water. She has been taking Mucinex and allergy medication to alleviate her symptoms.  The patient also reports numbness in his right foot following an incident last week when he hit his foot.  Despite the pain, she continues to walk on it due to work commitments.     Medical History[1]  Review of Systems   Review of Systems  Constitutional:  Positive for fever. Negative for appetite change, chills, diaphoresis and fatigue.  HENT:  Positive for ear pain and sore throat. Negative for congestion.   Respiratory:  Negative for cough, shortness of breath and wheezing.   Gastrointestinal:  Negative for diarrhea, nausea and vomiting.  Skin:  Negative for rash.     Physicial Exam   Physical Exam Vitals and nursing note reviewed.  Constitutional:      Appearance: Normal appearance.  HENT:     Head: Normocephalic and atraumatic.     Right Ear: Ear canal and external ear normal. A middle ear effusion is present. Tympanic membrane is erythematous and bulging.     Left Ear: Tympanic membrane, ear canal and external ear normal.     Nose: Nose normal.     Mouth/Throat:     Mouth: Mucous membranes are moist.     Pharynx: Oropharynx is clear. Uvula midline. Posterior oropharyngeal erythema present. No oropharyngeal exudate.     Tonsils: No tonsillar exudate or tonsillar  abscesses.  Eyes:     Conjunctiva/sclera: Conjunctivae normal.     Pupils: Pupils are equal, round, and reactive to light.  Cardiovascular:     Rate and Rhythm: Normal rate and regular rhythm.     Pulses: Normal pulses.     Heart sounds: Normal heart sounds.  Pulmonary:     Effort: Pulmonary effort is normal.     Breath sounds: Normal breath sounds.  Musculoskeletal:     Right foot: Tenderness (distal 5th metatarsal) present.  Lymphadenopathy:     Cervical: Cervical adenopathy present.  Skin:    General: Skin is warm and dry.     Capillary Refill: Capillary refill takes less than 2 seconds.  Neurological:     General: No focal deficit present.     Mental Status: He is alert and oriented to person, place, and time.  Psychiatric:        Mood and Affect: Mood normal.        Behavior: Behavior normal.      Vitals:   07/29/24 0929  BP: 138/88  Pulse: 61  Resp: 18  Temp: 98.1 F (36.7 C)  TempSrc: Oral  SpO2: 100%  Weight: 108 kg (238 lb)  Height: 1.753 m (5' 9)     Diagnosis   Karmella Kit was seen today for sore throat.  Diagnoses and all orders for this visit:  Strep pharyngitis -     amoxicillin -pot clavulanate (AUGMENTIN ) 875-125 mg per tablet; Take 1 tablet by  mouth 2 (two) times a day for 10 days.  Right acute otitis media -     amoxicillin -pot clavulanate (AUGMENTIN ) 875-125 mg per tablet; Take 1 tablet by mouth 2 (two) times a day for 10 days.  Right foot pain -     XR Foot Minimum 3 Views Right  Sore throat -     POC Mononucleosis Screen -     POC Rapid Strep A (IDNOW)  Other orders -     Discontinue: amoxicillin  (AMOXIL ) 500 mg capsule; Take 1 capsule (500 mg total) by mouth 2 (two) times a day for 10 days.     Medical Decision Making Desia Jasenia Weilbacher is a 23 y.o. adult who presents to urgent care today with complaints of  Chief Complaint  Patient presents with   Sore Throat    Pt complains that they have been sick for about 4 weeks, states  throat hurts, hurts to swallow, and has an earache. Took Mucinex for a week. Fever on and off for a few weeks     On exam, VSS and patient is afebrile. He appears well-hydrated with MMM and capillary refill <2 seconds.  Patient does have some mild posterior oropharyngeal erythema.  No tonsillar swelling or exudate.  He also does have anterior cervical adenopathy that is tender to palpation.  Right tympanic membrane with middle ear effusion and erythema and bulging.  Left tympanic membrane unremarkable.  Lungs CTA bilateral no wheezing, rhonchi, crackles.  Rapid strep is negative.  Monospot is negative.  Will treat with Augmentin  to treat for the strep and right acute otitis media.  Patient also was complaining of right foot pain from when he hit his right foot a week ago.   X-ray of right foot shows non-displaced fracture of the proximal phalanx of the 5th toe.  Recommend Motrin /Tylenol /ice. Patient is able to ambulate  Urgent Care Disposition:  Home Care  Labs   Recent Results (from the past 24 hours)  POC Rapid Strep A (IDNOW)   Collection Time: 07/29/24  9:36 AM  Result Value Ref Range   Strep A Molecular Positive (A) Negative   Internal Control Acceptable   POC Mononucleosis Screen   Collection Time: 07/29/24  9:50 AM  Result Value Ref Range   Infectious Mononucleosis Antibody Negative Negative   Internal Control Acceptable    Kit/Device Lot # [id number]    Kit/Device Expiration Date 09/06/2024      Imaging   No orders to display     Discharge Information   If a new prescription was given today, then I discussed potential side effects, drug interactions, instructions for taking the medication, and the consequences of not taking it.    F/u: Follow up closely with primary care provider (PCP) and other specialists for further care and routine care, but seek medical attention sooner if worsening/concerning signs or symptoms.     Electronically signed by: Waddell Rocky Ada, PA-C  07/29/2024 10:11 AM        [1] Past Medical History: Diagnosis Date   Anxiety    Depression

## 2024-09-09 ENCOUNTER — Ambulatory Visit (HOSPITAL_COMMUNITY)
Admission: EM | Admit: 2024-09-09 | Discharge: 2024-09-09 | Disposition: A | Discharge status: Home / Self Care | Source: Home / Self Care

## 2024-09-09 DIAGNOSIS — F411 Generalized anxiety disorder: Secondary | ICD-10-CM | POA: Diagnosis not present

## 2024-09-09 DIAGNOSIS — R4586 Emotional lability: Secondary | ICD-10-CM | POA: Diagnosis not present

## 2024-09-09 NOTE — Discharge Instructions (Addendum)
 Follow up with resources provided

## 2024-09-09 NOTE — Progress Notes (Signed)
" °   09/09/24 2005  [facility] Triage Screening (Walk-ins at [facility] only)  How Did You Hear About Us ? Self  What Is the Reason for Your Visit/Call Today? Pt presents to [facility] as a voluntary walk-in, unaccompanied with complaint of anxiety, stress and VH. Pt reports feeling  all over the place , losing track of time and constantly feeling like he is doing things wrong. He reports that last night he dozed off and woke up frantic getting ready for work only to realize that he had only been asleep for about 30 minutes. Pt reports feeling off for about 2-3 days. He reports ongoing grief at times and the anniversary of his mother's death just recently passed. Pt is established with outpatient therapist with sessions bi-weekly and [facility] for medication management. Pt is prescribed Sertraline, Buspar, Hydroxyzine and Quitiapine. Pt denies prior suicide attempts and inpatient hospitalization. Pt currently denies SI,HI, AH and substance/alcohol use.  How Long Has This Been Causing You Problems? <Week  Have You Recently Had Any Thoughts About Hurting Yourself? No  Are You Planning to Commit Suicide/Harm Yourself At This time? No  Have you Recently Had Thoughts About Hurting Someone Sherral? No  Are You Planning To Harm Someone At This Time? No  Physical Abuse Denies  Verbal Abuse Denies  Sexual Abuse Denies  Exploitation of patient/patient's resources Denies  Are you currently experiencing any auditory, visual or other hallucinations? No (saw shadowy figure last night)  Have You Used Any Alcohol or Drugs in the Past 24 Hours? No  Do you have any current medical co-morbidities that require immediate attention? No  Clinician description of patient physical appearance/behavior: flat affect, cooperative, pleasant  What Do You Feel Would Help You the Most Today? Treatment for Depression or other mood problem;Stress Management  If access to [facility] was not available, would you have sought care in the  Emergency Department? No  Determination of Need Routine (7 days)  Options For Referral Other: Comment;Outpatient Therapy;Medication Management    "
